# Patient Record
Sex: Male | Born: 1955 | Race: White | Hispanic: No | Marital: Single | Smoking: Never smoker
Health system: Southern US, Community
[De-identification: ages and names within clinical notes are randomized; demographics above are authoritative.]

## PROBLEM LIST (undated history)

## (undated) DIAGNOSIS — M2540 Effusion, unspecified joint: Secondary | ICD-10-CM

## (undated) DIAGNOSIS — R55 Syncope and collapse: Secondary | ICD-10-CM

## (undated) DIAGNOSIS — M2550 Pain in unspecified joint: Secondary | ICD-10-CM

## (undated) DIAGNOSIS — F988 Other specified behavioral and emotional disorders with onset usually occurring in childhood and adolescence: Secondary | ICD-10-CM

## (undated) DIAGNOSIS — S2249XA Multiple fractures of ribs, unspecified side, initial encounter for closed fracture: Secondary | ICD-10-CM

## (undated) HISTORY — PX: TONSILLECTOMY: SUR1361

---

## 2007-04-05 HISTORY — PX: APPENDECTOMY: SHX54

## 2007-04-23 ENCOUNTER — Inpatient Hospital Stay (HOSPITAL_COMMUNITY): Admission: EM | Admit: 2007-04-23 | Discharge: 2007-05-04 | Payer: Self-pay | Admitting: Emergency Medicine

## 2007-04-23 ENCOUNTER — Encounter (INDEPENDENT_AMBULATORY_CARE_PROVIDER_SITE_OTHER): Payer: Self-pay | Admitting: Surgery

## 2007-05-09 ENCOUNTER — Ambulatory Visit (HOSPITAL_COMMUNITY): Admission: RE | Admit: 2007-05-09 | Discharge: 2007-05-09 | Payer: Self-pay

## 2007-05-15 ENCOUNTER — Ambulatory Visit (HOSPITAL_COMMUNITY): Admission: RE | Admit: 2007-05-15 | Discharge: 2007-05-15 | Payer: Self-pay | Admitting: Diagnostic Radiology

## 2007-05-25 ENCOUNTER — Ambulatory Visit (HOSPITAL_COMMUNITY): Admission: RE | Admit: 2007-05-25 | Discharge: 2007-05-25 | Payer: Self-pay | Admitting: Surgery

## 2008-05-07 IMAGING — XA IR FISTULA/SINUS TRACT
1 series · 5 of 5 positions shown · non-contrast
Comparison: Study dated 05/04/07.

CLINICAL DATA: Right lower quadrant abscess.
EVALUATE ABSCESS DRAINAGE CATHETER AND COLONIC FISTULA:

[Series 1: run · 5 of 5 slices shown]
[im 1/5]
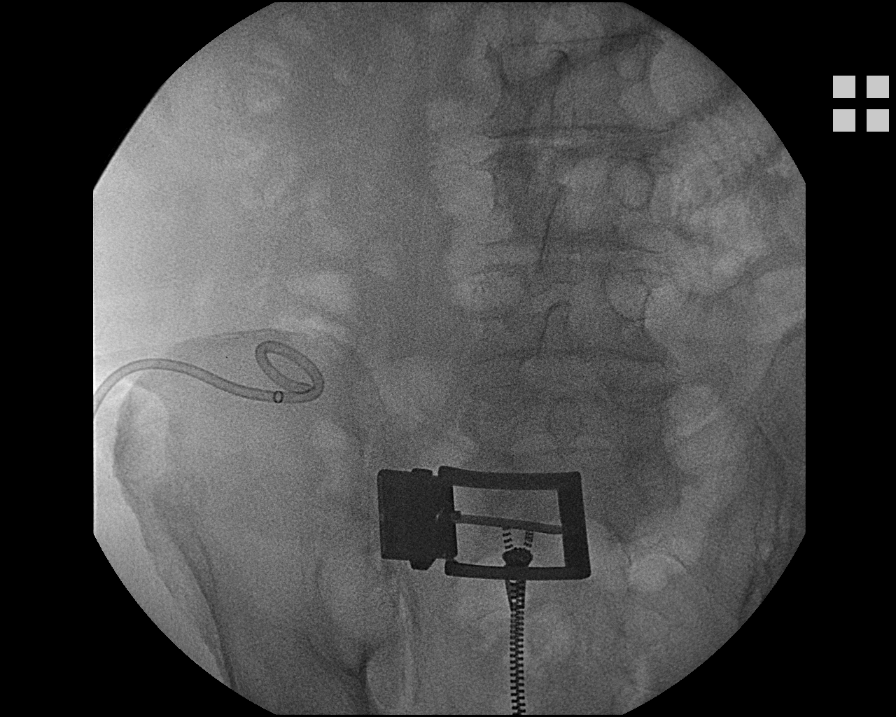
[im 2/5]
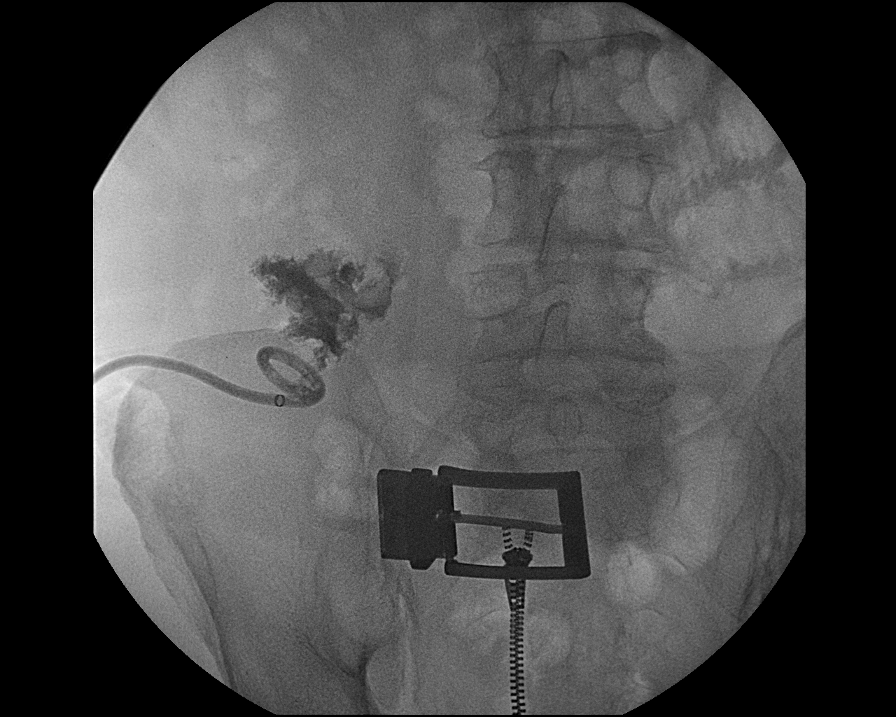
[im 3/5]
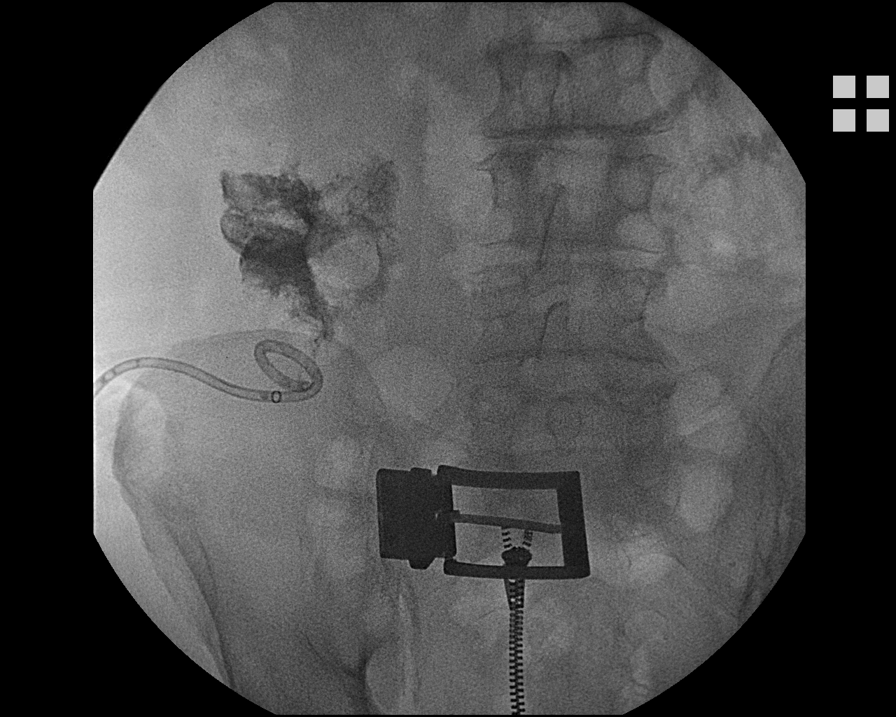
[im 4/5]
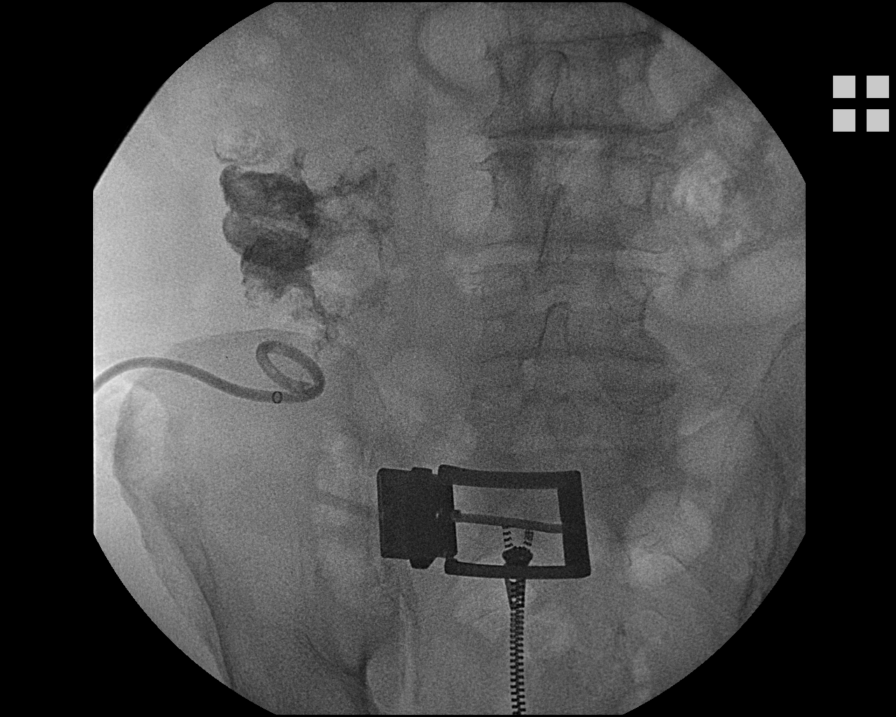
[im 5/5]
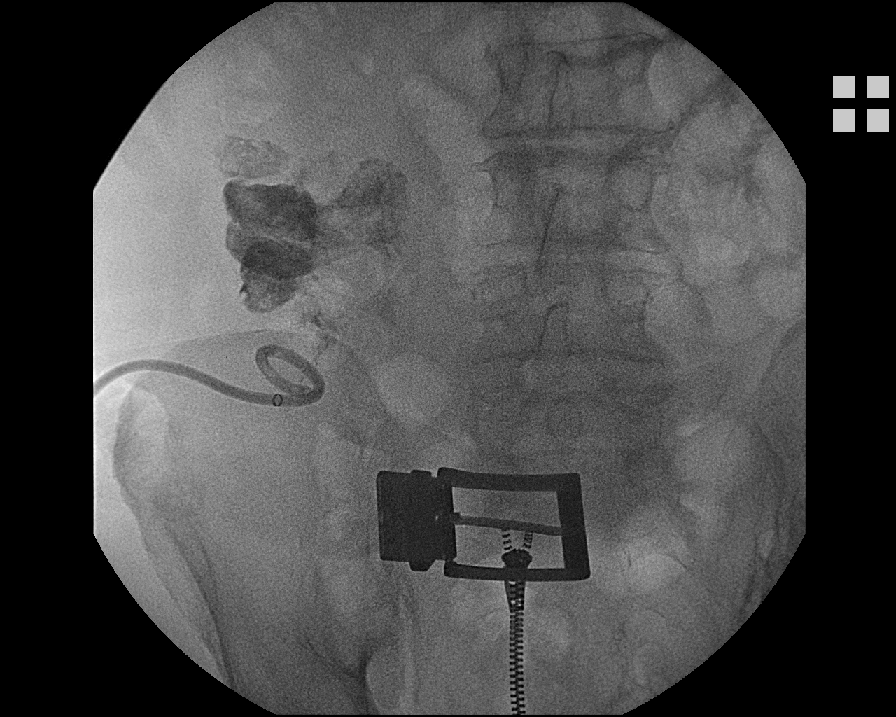

[5 of 5 positions shown; findings below may reference images not displayed]

Procedure:  Informed consent was obtained.  The patient was placed supine on the table.   A time out was performed.  The patient's catheter was found to be in stable position in the right lower quadrant of the abdomen.  Contrast injection demonstrated a small connection, which directly emptied into the cecum and right colon.  No significant residual abscess cavity.  In addition, the patient states that there has been minimal output.  
These findings were discussed with the patient.  I feel the patient would benefit from keeping the catheter in place to see if there is interval healing of this colonic fistula.
IMPRESSION: Persistent fistula connection to the right colon.  Plan is to keep the tube in place probably for one more week until the patient can have a follow-up fistulogram.

## 2010-08-17 NOTE — Op Note (Signed)
NAMEOSEI, ANGER NO.:  [id number]   MEDICAL RECORD NO.:  [id number]          PATIENT TYPE:  INP   LOCATION:  1829                         FACILITY:  [facility]   PHYSICIAN:  Sandria Bales. Ezzard Standing, M.D.  DATE OF BIRTH:  04/12/1955   DATE OF PROCEDURE:  04/23/2007  DATE OF DISCHARGE:                               OPERATIVE REPORT   Operative Note   Date of Surgery:  23 April 2007   PREOPERATIVE DIAGNOSIS:  Acute appendicitis.   POSTOPERATIVE DIAGNOSIS:  Ruptured appendicitis at base of appendix with  local abscess and appendolith.   PROCEDURE:  Laparoscopic appendectomy.   SURGEON:  Sandria Bales. Ezzard Standing, M.D.   FIRST ASSISTANT:  Launa Flight, PA student   ANESTHESIA:  General endotracheal.   ESTIMATED BLOOD LOSS:  Minimal.   HISTORY OF PRESENT ILLNESS:  This is a 55 year old white male who has no  identified primary medical doctor, developed acute abdominal pain on  Sunday, April 22, 2007, went to the Urgent Care on [facility] on  [employer] today, was sent to [facility] Radiology who did a  CT scan which suggested appendicitis.  He was sent to the [facility]  emergency room.  We have no records from either Urgent Care at [facility] or [facility]iology other than the DVD disk that had the  images on his CT scan.   I reviewed the CT scan on the disk with Dr. Abelino Derrick which appears  demonstrate an appendicitis with a fecolith.   I discussed with the patient proceeding with laparoscopic appendectomy.  I discussed the indications, potential complication, potential  complications include bleeding, infection which I think he already has,  open surgery, bowel injury.   OPERATIVE NOTE:  The patient placed in supine position, given a general  endotracheal anesthetic.  His was given a gram of cefoxitin at  initiation of procedure.  He had PAS stockings in place, Foley catheter  in place.  His abdomen was prepped with Betadine solution and  sterilely  draped.  A time out was taken to identify the patient and the procedure.   I used four trocars.  I made a 10-mm Hassan trocar at the infraumbilical  incision, secured with a 0-0 Vicryl suture, I placed a 5 mm trocar in  the right upper quadrant.  I placed a 5 mm trocar in the right lower  quadrant and a 10 mm trocar 11 mm trocar in the left lower quadrant.  Abdominal exploration revealed right left lobes of liver unremarkable.  Stomach unremarkable.  The patient had evidence of inflammatory changes  of small bowel with a walled off abscess in the right lower quadrant.  I  took down the omentum covering this abscess.  His appendix actually was  retrocecal and tracked up the lateral wall of his right colon.  At the  base of the appendix was approximately 1-cm hole with a stool  ball/appendolith which had ruptured and was in the middle of an abscess.   I irrigated out the abscess. I  removed the appendolith and tried to  make  sure I did not leave any material behind.  I then dissected off the  appendix from the lateral wall of the colon.  I got below the stump and  got a firing of a 45 Endo-GIA blue load across the base of the appendix  and placed the appendix in an EndoCatch bag and delivered it through the  umbilicus.  Because I was a little worried about how tenuous the staple  line was though it looked intact.  I oversewed the staple line with  three sutures of 2-0 Vicryl, sewn laparoscopically.  I then irrigated  his abdominal cavity with about 2 liters of saline.  There was no  residual purulence I could see.  There was no residual appendolith I  could see I then removed the trocars in turn.  There is no bleeding in  any trocar site.  The umbilical port was closed with 0-0 Vicryl suture.  The skin each port closed with 5-0 Vicryl suture, painted with tincture  of benzoin and steri-stripped.   The patient tolerated procedure well, was transferred to recovery room  good  condition.  Sponge and needle counts were correct the end of the  case.      Sandria Bales. Ezzard Standing, M.D.  Electronically Signed     DHN/MEDQ  D:  04/23/2007  T:  04/24/2007  Job:  [id number]

## 2010-08-17 NOTE — H&P (Signed)
NAMEAREG, BIALAS NO.:  [id number]   MEDICAL RECORD NO.:  [id number]          PATIENT TYPE:  INP   LOCATION:  1829                         FACILITY:  [facility]   PHYSICIAN:  Sandria Bales. Ezzard Standing, M.D.  DATE OF BIRTH:  06-18-1955   DATE OF ADMISSION:  04/23/2007  DATE OF DISCHARGE:                              HISTORY & PHYSICAL   HISTORY AND PHYSICAL:   DATE:  April 23, 2007   REFERRING PHYSICIAN:  Dr. Jake Church of [facility]   HISTORY OF ILLNESS:  This is a 55 year old white male who has no primary  medical physician and developed abdominal pain which he described as  knife-life in his right side on Sunday, the 18th of January 2009.  He  also was having multiple loose stools according to his mother, as many  as 8 or 10 a day.  He just kind of tolerated it Sunday but continued to  feel poorly on Monday, the 19th of January.   He went to [facility] and saw a physician whose name  he cannot remember.  That physician referred him to [facility]  Radiology.  We have no paperwork form [facility] of [facility] At  [facility] Radiology, he had a CT scan of his abdomen.  He was told he  had appendicitis and was given a disc but no paperwork and was told to  go to the [facility] Emergency Room.  Apparently, he sat in the waiting  area an hour plus or minus before being seen.   He denies a history of peptic ulcer disease, liver disease, pancreatic  disease or colon disease, and he has had no prior abdominal surgery.   PAST MEDICAL HISTORY:  He is allergic to penicillin.  He said it made  his legs turn orange, and this happened when he was a child.   CURRENT MEDICATIONS:  He is on no current medication.   REVIEW OF SYSTEMS:  NEUROLOGIC:  No history of seizure or loss of  consciousness.  PULMONARY:  Does not smoke cigarettes.  No pneumonia or tuberculosis.  CARDIAC:  No heart disease, chest pain, or hypertension.  GASTROINTESTINAL:  See history of present illness.  UROLOGIC:  No history of kidney stones or kidney infection.   In the emergency room he is accompanied by his mother and his pastor.   PAST SURGICAL HISTORY:  Prior operations include a tonsillectomy, and I  think he broke a bone in his right foot before.   SOCIAL HISTORY:  He is unemployed since June of 2008 when he worked in [employer].  He lives with his mother, who is at his bedside.   PHYSICAL EXAMINATION:  VITAL SIGNS:  Temperature 99.2, pulse 96, blood  pressure 137/86, respirations 22.  GENERAL:  He is a well-nourished, pleasant white male, alert and  cooperative on physical exam.  HEENT:  Unremarkable.  NECK:  Supple.  I feel no masses or thyromegaly.  LUNGS:  Clear to auscultation.  CARDIAC:  His heart has a regular rate and rhythm without murmur or  rub.  ABDOMEN:  He has positive bowel sounds, but he is tender with some  guarding in his right side in the right lower quadrant.  He is mildly  distended.  I feel no mass, no hernia.  EXTREMITIES:  He has good strength in the upper and lower extremities.  NEUROLOGIC:  Grossly intact.   LABORATORY DATA:  The labs I have show a white blood count of 19,100,  hemoglobin 17, hematocrit 50, sodium 136, potassium 4.0, chloride 100,  CO2 24, glucose 120.   DATA REVIEWED:  A CT scan, which again I had no typed report, was  reviewed with Abelino Derrick, and this was a disc that we looked at.  The  CT scan does show an apparent appendolith right at the base of the  appendix with some surrounding inflammation.  This would be consistent  with appendicitis.  There was no other obvious acute disease within his  abdomen.   IMPRESSION:  1. Appendicitis.  I discussed with the patient proceeding with an appendectomy.  I  discussed it with laparoscopy and open surgery and the risks including  bleeding, infection which I think he already has, and the possibility of  open surgery.  We  will go ahead tonight and proceed with his  appendectomy.      Sandria Bales. Ezzard Standing, M.D.  Electronically Signed     DHN/MEDQ  D:  04/23/2007  T:  04/24/2007  Job:  [id number]

## 2010-08-17 NOTE — Discharge Summary (Signed)
[name], [name] NO.:  [id number]   MEDICAL RECORD NO.:  [id number]          PATIENT TYPE:  INP   LOCATION:  5127                         FACILITY:  [facility]   PHYSICIAN:  [name] Lambert, M.D. DATE OF BIRTH:  1955-05-26   DATE OF ADMISSION:  04/23/2007  DATE OF DISCHARGE:  05/04/2007                               DISCHARGE SUMMARY   DISCHARGING PHYSICIAN:  [name] Lambert, M.D.   OPERATING SURGEON:  [name] Lambert, M.D.   REASON FOR ADMISSION:  This is a 55 year old white male who has no  primary medical physician and developed abdominal pain which he  describes as stabbing in his right side, on Sunday January 18.  He also  began having multiple loose stools, according to his mother.  He says  that they could be anywhere from 8-10 a day.  His pain began to worsen  late Sunday into early Monday, January 19.  He went to the Urgent Care  Clinic on [facility] and was seen by a physician there.  This  physician referred him to [facility].  Here he had a CT of  his abdomen, and was told that he had appendicitis, was given a disk but  no paperwork, and was told to come to [facility] Emergency  Room.   PHYSICAL EXAM:  The patient had a temperature of 99.2, and he had  positive bowel sounds; but he was tender with some guarding on the right  side in his right lower quadrant.  He was mildly distended, and no  masses or hernias were noted.  White blood cell count, at this time, was  19,100.  At this time, the patient was admitted to the hospital for an  appendectomy to be done by Dr. Ezzard Lambert.  Admitting diagnosis was acute  appendicitis.   HOSPITAL COURSE:  Once the patient was admitted he was taken to the OR  for a laparoscopic appendectomy.  Once in, the patient was found to have  a ruptured appendicitis with an abscess in the abdomen as well.  The  abscess was irrigated.  An appendicolith was found and was removed, as  well.  The  appendix was dissected off the lateral wall of the colon and  then removed.  The patient was then taken to his room and placed on IV  cefoxitin 1 gram.   The next day the patient was feeling well, and had tolerated clear  liquids for breakfast, and was wanting some real food.  At this time,  his pain was well controlled and was actually not taking anything for  pain medicine.  His abdomen, at this time, was somewhat soft, slightly  distended with some mild tenderness and positive bowel sounds.  His  incisions were covered with gauze and minimal dry drainage was noted.  At this time, he was continued on his IV antibiotics, and his diet was  advanced.   Later that night after dinner, the patient began to have some nausea and  had 2 emesis.  The next day he was back down to clear  liquids since he  had developed some nausea with a regular diet, and he was beginning to  form a postoperative ileus.  At this time his white blood cell count had  come down from 19,100 to 16,300.  His IV antibiotics were continued as  well.  An abdominal x-ray was also taken on postoperative day #2 which  showed a small-bowel obstruction; and, therefore, an NG-tube was placed  to decompress the stomach.  By postoperative day #3 there was no  improvement seen on his films that still showed large amounts of air  fluid levels, and his NG-tube was continued at this time.  He was having  green bilious drainage out of his tube.   At this time his white blood cell count had come down from 16,300 to  8700.  Later that night the patient states that his NG-tube was  accidentally pulled out.  The next morning on postoperative day #4 his  repeat abdominal x-rays showed no improvement.  However, the patient was  noted to have said that since his NG-tube, had come out he had no  emesis, and was feeling much better with the tube out of this nose.  However, when asked the patient did state that he did kind of full.  At this  point the patient refused to have the NG-tube replaced.  At this  time we were going to order a CT of the abdomen and pelvis to assess for  an abscess or something that might be causing the obstructive pattern on  the x-ray.   Later that day, I was called by the nurse to say that the patient had 4  emesis, and she had reinserted the NG-tube and had already gotten almost  a liter of output within an hour of replacing the NG-tube.  The CT scan  came back later that day and showed and 9 x 4 cm abscess in the right  lower quadrant that was adjacent to the cecum and abdominal wall.   At this time we asked interventional radiology to assess the patient for  a percutaneous drain to be placed in this abscess.  On postoperative day  #6 IR placed the percutaneous drain and removed 45 mL of cloudy red,  serous fluid.  At this time it was sent for culture which later came  back and showed some white blood cells, but no organisms were seen.   Over the next several days the patient continued with his percutaneous  drain as well as his NG-tube.  Several attempts were made to clamp the  patient's NG-tube, however, his residuals were greater than 200; and the  patient was put back up to suction.  On postoperative day #8, the  patient's white blood cell count began to rise to 14,100; and,  therefore, we are concerned that his abscess might not be healing or  that he might be forming another abscess.  However, we were also aware  that this might not be the  cause of his increase in white count, and a  urinalysis and culture and sensitivity, as well as blood cultures were  done which all came back negative.   At this time, the patient also was having beginning to have bowel sounds  as well as flatus and BM.  On postoperative day #9 the patient's NG-tube  was finally discontinued and the patient was allowed to have sips of  clear liquids.  The patient tolerated these clear liquids and by the  next day  was  tolerating a clear liquid tray for breakfast.  A repeat CT  of the abdomen was also performed which actually showed an improving  right lower quadrant abscess that measured 1 x 2 cm.  At this time the  patient's right lower quadrant drain was also beginning to have  decreased output, and the patient was taken down on the day of discharge  to have it injected to see if it was able to be removed at this time or  not.  I have instructed radiology that if the drain is not removed, to  please instruct the patient on discharge instructions for the drain, and  when he might need to return for further drain evaluation.   The patient's diet was advanced and on the date of discharge the patient  was tolerating a regular diet.  At this time the patient was still  having good bowel sounds, flatus, as well as bowel movement.   On postoperative day #1 the patient began to have elevations in his  blood pressure found to be 142/117 and was given a Catapres patch to  control his blood pressure.  This patch was given based on the fact that  the patient was n.p.o. with an NG-tube for several days.  The patient's  blood pressure began to stabilize and continued to be well managed in  the 130s/70s-80s.  The patient at time of discharge was sent home with a  prescription for a Catapres patch and told to follow up with a primary  care physician when he was able to find one.   On postoperative day #8 the patient was found to have a white count that  was beginning to elevate.  At this time, urinalysis and culture, as well  as, blood cultures and repeat diagnostics were done.  All of these came  back negative.  However, while the patient was in radiology having his  repeat CT done, one of the radiology techs noticed some redness on the  patient's arm where one of his IV sites had been discontinued several  days prior.  At this time the patient was found to have superficial  thrombophlebitis.  At this time the  patient was started on vancomycin  and his cefoxitin was discontinued.  Over the next several days the  patient's white count began to decrease and by day of discharge his  white count had normalized to 8100.  His redness, as well as induration,  continued to improve; and the patient was stable enough to be sent home  with a prescription of doxycycline for 7 days.   DISCHARGE DIAGNOSES:  1. Acute perforated appendicitis/status post laparoscopic      appendectomy.  2. Right lower quadrant abscess/status post percutaneous drain by      interventional radiology pending removal today.  3. Hypertension stable on Catapres patch.  4. Small-bowel obstruction resolved.  5. Leukocytosis which has resolved.  6. Superficial thrombophlebitis which is improving.   DISCHARGE MEDICATIONS:  The patient is sent home with prescriptions for:  1. Doxycycline 100 mg 1 tablet b.i.d. for 7 days.  2. Catapres patch 0.1 mg apply one patch every 7 days for      hypertension.  3. Prescription for ibuprofen 600 mg 1 tablet t.i.d. x1 week,  4. Darvocet N 100 one tablet q.6 h. as needed for pain.  5. He is recommended to take over-the-counter omeprazole as instructed      on the patch for 1 week while taking the  ibuprofen.  6. The patient is on no prior home medications.   DISCHARGE INSTRUCTIONS:  The patient is told that he is to increase  activity slowly; may shower, tomorrow, once he gets home (at this time  he is not to bathe for the next 2 weeks).  He is also informed that he  is not to lift anything greater than 15 pounds for the next 2 weeks.  He  has no restrictions, at this time, with his diet; and he is told that he  needs to return to see Dr. Ezzard Lambert in 1 week for followup of his  laparoscopic appendectomy, as well as for his superficial  thrombophlebitis.  He is also informed that if he begins to have a fever  greater than 101.5 or increase in abdominal pain, he needs to call our  office as well as  if he begins to have an increase in redness or pain in  his left upper extremity from his superficial thrombophlebitis, but he  was told to call our office as well.  He was also encouraged to find a  primary care physician and follow up with them as soon as possible for  management of his hypertension.  At this time I am not aware of whether  the patient will have his percutaneous drain removed, today, by  interventional radiology or not; however, I have written a not for  radiology to instruct the patient on further care for his drain or time  for further evaluation if the drain is not removed today.      [name] Cape, PA      [name] Lambert, M.D.  Electronically Signed    KEO/MEDQ  D:  05/04/2007  T:  05/04/2007  Job:  [id number]   cc:   [name] Lambert, M.D.

## 2010-12-24 LAB — CBC
HCT: 36 — ABNORMAL LOW
HCT: 38.9 — ABNORMAL LOW
HCT: 39.2
HCT: 39.8
HCT: 40.6
HCT: 42
HCT: 42.5
HCT: 50.1
Hemoglobin: 12.2 — ABNORMAL LOW
Hemoglobin: 13.5
Hemoglobin: 13.8
Hemoglobin: 14
Hemoglobin: 14.3
Hemoglobin: 17
MCHC: 33.8
MCHC: 33.9
MCHC: 33.9
MCHC: 34
MCHC: 34
MCHC: 34.8
MCV: 90.2
MCV: 90.3
MCV: 90.3
MCV: 90.8
MCV: 91.3
MCV: 92
Platelets: 282
Platelets: 331
Platelets: 337
Platelets: 407 — ABNORMAL HIGH
Platelets: 427 — ABNORMAL HIGH
Platelets: 433 — ABNORMAL HIGH
Platelets: 442 — ABNORMAL HIGH
Platelets: 457 — ABNORMAL HIGH
RBC: 3.94 — ABNORMAL LOW
RBC: 4.36
RBC: 4.64
RBC: 5.55
RDW: 12.9
RDW: 12.9
RDW: 13.2
RDW: 13.5
RDW: 13.7
WBC: 10.8 — ABNORMAL HIGH
WBC: 11.7 — ABNORMAL HIGH
WBC: 14.1 — ABNORMAL HIGH
WBC: 14.2 — ABNORMAL HIGH
WBC: 8.1
WBC: 8.7
WBC: 8.8

## 2010-12-24 LAB — BASIC METABOLIC PANEL
BUN: 10
BUN: 11
BUN: 11
BUN: 14
BUN: 14
BUN: 9
BUN: 9
CO2: 22
CO2: 24
CO2: 24
CO2: 26
Calcium: 8.2 — ABNORMAL LOW
Calcium: 8.4
Calcium: 8.7
Calcium: 8.9
Chloride: 100
Chloride: 108
Chloride: 97
Chloride: 97
Chloride: 97
Creatinine, Ser: 1.08
Creatinine, Ser: 1.18
Creatinine, Ser: 1.19
Creatinine, Ser: 1.35
Creatinine, Ser: 1.72 — ABNORMAL HIGH
GFR calc Af Amer: 51 — ABNORMAL LOW
GFR calc Af Amer: >60
GFR calc Af Amer: >60
GFR calc Af Amer: >60
GFR calc non Af Amer: 55 — ABNORMAL LOW
GFR calc non Af Amer: 59 — ABNORMAL LOW
GFR calc non Af Amer: >60
GFR calc non Af Amer: >60
GFR calc non Af Amer: >60
Glucose, Bld: 116 — ABNORMAL HIGH
Glucose, Bld: 136 — ABNORMAL HIGH
Potassium: 4.1
Potassium: 4.2
Potassium: 4.3
Potassium: 4.5
Potassium: 4.6
Sodium: 130 — ABNORMAL LOW
Sodium: 134 — ABNORMAL LOW
Sodium: 135
Sodium: 136
Sodium: 136

## 2010-12-24 LAB — BODY FLUID CULTURE: Culture: NO GROWTH

## 2010-12-24 LAB — CULTURE, BLOOD (ROUTINE X 2): Culture: NO GROWTH

## 2010-12-24 LAB — DIFFERENTIAL
Basophils Relative: 0
Eosinophils Absolute: 0
Eosinophils Relative: 0
Monocytes Absolute: 1
Monocytes Relative: 5

## 2010-12-24 LAB — URINE CULTURE
Colony Count: NO GROWTH
Culture: NO GROWTH

## 2011-02-28 ENCOUNTER — Encounter: Payer: Self-pay | Admitting: Family Medicine

## 2011-02-28 ENCOUNTER — Observation Stay (HOSPITAL_COMMUNITY)
Admission: EM | Admit: 2011-02-28 | Discharge: 2011-03-02 | Disposition: A | Discharge status: Home / Self Care | Payer: No Typology Code available for payment source | Source: Ambulatory Visit | Attending: General Surgery | Admitting: General Surgery

## 2011-02-28 ENCOUNTER — Emergency Department (HOSPITAL_COMMUNITY): Payer: No Typology Code available for payment source

## 2011-02-28 DIAGNOSIS — S2220XA Unspecified fracture of sternum, initial encounter for closed fracture: Principal | ICD-10-CM | POA: Insufficient documentation

## 2011-02-28 DIAGNOSIS — S2249XA Multiple fractures of ribs, unspecified side, initial encounter for closed fracture: Secondary | ICD-10-CM

## 2011-02-28 DIAGNOSIS — Y92410 Unspecified street and highway as the place of occurrence of the external cause: Secondary | ICD-10-CM | POA: Insufficient documentation

## 2011-02-28 DIAGNOSIS — IMO0002 Reserved for concepts with insufficient information to code with codable children: Secondary | ICD-10-CM | POA: Insufficient documentation

## 2011-02-28 DIAGNOSIS — R51 Headache: Secondary | ICD-10-CM | POA: Insufficient documentation

## 2011-02-28 LAB — POCT I-STAT, CHEM 8
BUN: 19 mg/dL (ref 6–23)
Calcium, Ion: 1.13 mmol/L (ref 1.12–1.32)
Creatinine, Ser: 1.4 mg/dL — ABNORMAL HIGH (ref 0.50–1.35)
TCO2: 27 mmol/L (ref 0–100)

## 2011-02-28 MED ORDER — PANTOPRAZOLE SODIUM 40 MG PO TBEC
40.0000 mg | DELAYED_RELEASE_TABLET | Freq: Every day | ORAL | Status: DC
  Administered 2011-03-01: 40 mg via ORAL
  Filled 2011-02-28 (×2): qty 1

## 2011-02-28 MED ORDER — SODIUM CHLORIDE 0.9 % IV SOLN: 250.0000 mL | INTRAVENOUS | Status: DC | PRN

## 2011-02-28 MED ORDER — ONDANSETRON HCL 4 MG PO TABS: 4.0000 mg | ORAL_TABLET | Freq: Four times a day (QID) | ORAL | Status: DC | PRN

## 2011-02-28 MED ORDER — SODIUM CHLORIDE 0.9 % IV BOLUS (SEPSIS)
1000.0000 mL | Freq: Once | INTRAVENOUS | Status: AC
  Administered 2011-02-28: 1000 mL via INTRAVENOUS

## 2011-02-28 MED ORDER — OXYCODONE HCL 5 MG PO TABS
5.0000 mg | ORAL_TABLET | ORAL | Status: DC | PRN
  Administered 2011-03-01 (×2): 5 mg via ORAL
  Filled 2011-02-28 (×2): qty 1

## 2011-02-28 MED ORDER — HYDROMORPHONE HCL PF 1 MG/ML IJ SOLN
INTRAMUSCULAR | Status: AC
  Administered 2011-02-28: 1 mg via INTRAVENOUS
  Filled 2011-02-28: qty 1

## 2011-02-28 MED ORDER — MORPHINE SULFATE 4 MG/ML IJ SOLN
4.0000 mg | Freq: Once | INTRAMUSCULAR | Status: AC
  Administered 2011-02-28: 4 mg via INTRAVENOUS
  Filled 2011-02-28: qty 1

## 2011-02-28 MED ORDER — ONDANSETRON HCL 4 MG/2ML IJ SOLN
4.0000 mg | Freq: Once | INTRAMUSCULAR | Status: AC
Start: 2011-02-28 — End: 2011-02-28
  Administered 2011-02-28: 4 mg via INTRAVENOUS
  Filled 2011-02-28: qty 2

## 2011-02-28 MED ORDER — SODIUM CHLORIDE 0.9 % IJ SOLN
3.0000 mL | Freq: Two times a day (BID) | INTRAMUSCULAR | Status: DC
  Administered 2011-02-28 – 2011-03-01 (×3): 3 mL via INTRAVENOUS

## 2011-02-28 MED ORDER — IOHEXOL 300 MG/ML  SOLN
80.0000 mL | Freq: Once | INTRAMUSCULAR | Status: AC | PRN
  Administered 2011-02-28: 80 mL via INTRAVENOUS

## 2011-02-28 MED ORDER — SODIUM CHLORIDE 0.9 % IJ SOLN: 3.0000 mL | INTRAMUSCULAR | Status: DC | PRN

## 2011-02-28 MED ORDER — ONDANSETRON HCL 4 MG/2ML IJ SOLN
4.0000 mg | Freq: Four times a day (QID) | INTRAMUSCULAR | Status: DC | PRN
  Administered 2011-02-28 – 2011-03-01 (×2): 4 mg via INTRAVENOUS
  Filled 2011-02-28 (×2): qty 2

## 2011-02-28 MED ORDER — DOCUSATE SODIUM 100 MG PO CAPS
100.0000 mg | ORAL_CAPSULE | Freq: Two times a day (BID) | ORAL | Status: DC
  Administered 2011-02-28 – 2011-03-01 (×3): 100 mg via ORAL
  Filled 2011-02-28 (×7): qty 1

## 2011-02-28 MED ORDER — PANTOPRAZOLE SODIUM 40 MG IV SOLR
40.0000 mg | Freq: Every day | INTRAVENOUS | Status: DC
  Administered 2011-02-28: 40 mg via INTRAVENOUS
  Filled 2011-02-28 (×3): qty 40

## 2011-02-28 MED ORDER — BISACODYL 10 MG RE SUPP: 10.0000 mg | Freq: Every day | RECTAL | Status: DC | PRN

## 2011-02-28 MED ORDER — HYDROMORPHONE HCL PF 1 MG/ML IJ SOLN
0.5000 mg | INTRAMUSCULAR | Status: DC | PRN
  Administered 2011-02-28 – 2011-03-02 (×6): 1 mg via INTRAVENOUS
  Filled 2011-02-28 (×5): qty 1

## 2011-02-28 MED ORDER — MORPHINE SULFATE 4 MG/ML IJ SOLN
6.0000 mg | Freq: Once | INTRAMUSCULAR | Status: AC
  Administered 2011-02-28: 4 mg via INTRAVENOUS
  Filled 2011-02-28: qty 1
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
## 2011-03-01 ENCOUNTER — Observation Stay (HOSPITAL_COMMUNITY): Payer: No Typology Code available for payment source

## 2011-03-01 LAB — BASIC METABOLIC PANEL
CO2: 28 mEq/L (ref 19–32)
Chloride: 104 mEq/L (ref 96–112)
Sodium: 139 mEq/L (ref 135–145)

## 2011-03-01 LAB — CBC
MCV: 90.5 fL (ref 78.0–100.0)
Platelets: 171 10*3/uL (ref 150–400)
RBC: 4.84 MIL/uL (ref 4.22–5.81)
WBC: 8.8 10*3/uL (ref 4.0–10.5)

## 2011-03-01 MED ORDER — OXYCODONE HCL 5 MG PO TABS
5.0000 mg | ORAL_TABLET | ORAL | Status: DC | PRN
  Administered 2011-03-01 – 2011-03-02 (×3): 10 mg via ORAL
  Filled 2011-03-01 (×4): qty 2

## 2011-03-01 MED ORDER — ENOXAPARIN SODIUM 30 MG/0.3ML ~~LOC~~ SOLN
30.0000 mg | Freq: Two times a day (BID) | SUBCUTANEOUS | Status: DC
  Administered 2011-03-01 (×2): 30 mg via SUBCUTANEOUS
  Filled 2011-03-01 (×4): qty 0.3

## 2011-03-01 NOTE — Progress Notes (Signed)
Clinical Social Work-Please see shadow chart for full assessment; pt declines any substance use and declines any CSW support services. SBIRT completed. No further needs at this time. Jodean Lima, [phone]
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
## 2011-03-02 ENCOUNTER — Observation Stay (HOSPITAL_COMMUNITY): Payer: No Typology Code available for payment source

## 2011-03-02 DIAGNOSIS — S2249XA Multiple fractures of ribs, unspecified side, initial encounter for closed fracture: Secondary | ICD-10-CM

## 2011-03-02 DIAGNOSIS — S2220XA Unspecified fracture of sternum, initial encounter for closed fracture: Secondary | ICD-10-CM

## 2011-03-02 MED ORDER — OXYCODONE-ACETAMINOPHEN 7.5-325 MG PO TABS: 1.0000 | ORAL_TABLET | ORAL | Status: AC | PRN
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
## 2011-03-02 NOTE — Progress Notes (Signed)
This patient has been seen and I agree with the findings and treatment plan. Okay to go home.   Marta Lamas. Gae Bon, MD, FACS [phone] (pager) [phone] (direct pager) Trauma Surgeon

## 2011-03-02 NOTE — Discharge Summary (Signed)
This patient has been seen and I agree with the findings and treatment plan.  Alaylah Heatherington O. Brindle Leyba, III, MD, FACS [phone] (pager) [phone] (direct pager) Trauma Surgeon  
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
## 2012-12-31 ENCOUNTER — Emergency Department (HOSPITAL_COMMUNITY): Payer: BC Managed Care – PPO

## 2012-12-31 ENCOUNTER — Observation Stay (HOSPITAL_COMMUNITY)
Admission: EM | Admit: 2012-12-31 | Discharge: 2013-01-01 | Disposition: A | Discharge status: Home / Self Care | Payer: BC Managed Care – PPO | Attending: Internal Medicine | Admitting: Internal Medicine

## 2012-12-31 ENCOUNTER — Encounter (HOSPITAL_COMMUNITY): Payer: Self-pay | Admitting: Nurse Practitioner

## 2012-12-31 DIAGNOSIS — W19XXXA Unspecified fall, initial encounter: Secondary | ICD-10-CM | POA: Insufficient documentation

## 2012-12-31 DIAGNOSIS — N179 Acute kidney failure, unspecified: Secondary | ICD-10-CM

## 2012-12-31 DIAGNOSIS — S42009A Fracture of unspecified part of unspecified clavicle, initial encounter for closed fracture: Secondary | ICD-10-CM | POA: Insufficient documentation

## 2012-12-31 DIAGNOSIS — E860 Dehydration: Secondary | ICD-10-CM

## 2012-12-31 DIAGNOSIS — S2231XA Fracture of one rib, right side, initial encounter for closed fracture: Secondary | ICD-10-CM

## 2012-12-31 DIAGNOSIS — R55 Syncope and collapse: Principal | ICD-10-CM

## 2012-12-31 DIAGNOSIS — S42001A Fracture of unspecified part of right clavicle, initial encounter for closed fracture: Secondary | ICD-10-CM

## 2012-12-31 DIAGNOSIS — S2239XA Fracture of one rib, unspecified side, initial encounter for closed fracture: Secondary | ICD-10-CM | POA: Insufficient documentation

## 2012-12-31 DIAGNOSIS — M25519 Pain in unspecified shoulder: Secondary | ICD-10-CM | POA: Insufficient documentation

## 2012-12-31 DIAGNOSIS — S0100XA Unspecified open wound of scalp, initial encounter: Secondary | ICD-10-CM | POA: Insufficient documentation

## 2012-12-31 DIAGNOSIS — I10 Essential (primary) hypertension: Secondary | ICD-10-CM

## 2012-12-31 DIAGNOSIS — IMO0002 Reserved for concepts with insufficient information to code with codable children: Secondary | ICD-10-CM | POA: Insufficient documentation

## 2012-12-31 HISTORY — DX: Syncope and collapse: R55

## 2012-12-31 HISTORY — DX: Multiple fractures of ribs, unspecified side, initial encounter for closed fracture: S22.49XA

## 2012-12-31 LAB — POCT I-STAT, CHEM 8
Chloride: 103 mEq/L (ref 96–112)
Creatinine, Ser: 1.3 mg/dL (ref 0.50–1.35)
Glucose, Bld: 92 mg/dL (ref 70–99)
HCT: 50 % (ref 39.0–52.0)
Potassium: 4.3 mEq/L (ref 3.5–5.1)
Sodium: 139 mEq/L (ref 135–145)
TCO2: 28 mmol/L (ref 0–100)

## 2012-12-31 LAB — GLUCOSE, CAPILLARY: Glucose-Capillary: 102 mg/dL — ABNORMAL HIGH (ref 70–99)

## 2012-12-31 LAB — CBC
HCT: 47.9 % (ref 39.0–52.0)
Hemoglobin: 16.8 g/dL (ref 13.0–17.0)
MCH: 31.2 pg (ref 26.0–34.0)
Platelets: 176 10*3/uL (ref 150–400)
RDW: 12.9 % (ref 11.5–15.5)
WBC: 13.4 10*3/uL — ABNORMAL HIGH (ref 4.0–10.5)

## 2012-12-31 LAB — POCT I-STAT TROPONIN I: Troponin i, poc: 0 ng/mL (ref 0.00–0.08)

## 2012-12-31 MED ORDER — ONDANSETRON HCL 4 MG/2ML IJ SOLN: 4.0000 mg | Freq: Three times a day (TID) | INTRAMUSCULAR | Status: AC | PRN

## 2012-12-31 MED ORDER — SODIUM CHLORIDE 0.9 % IV SOLN: 250.0000 mL | INTRAVENOUS | Status: DC | PRN

## 2012-12-31 MED ORDER — ALUM & MAG HYDROXIDE-SIMETH 200-200-20 MG/5ML PO SUSP: 30.0000 mL | Freq: Four times a day (QID) | ORAL | Status: DC | PRN

## 2012-12-31 MED ORDER — SODIUM CHLORIDE 0.9 % IJ SOLN: 3.0000 mL | Freq: Two times a day (BID) | INTRAMUSCULAR | Status: DC

## 2012-12-31 MED ORDER — HYDROCODONE-ACETAMINOPHEN 5-325 MG PO TABS
1.0000 | ORAL_TABLET | ORAL | Status: DC | PRN
  Administered 2012-12-31 – 2013-01-01 (×4): 2 via ORAL
  Filled 2012-12-31 (×4): qty 2

## 2012-12-31 MED ORDER — HYDROCODONE-ACETAMINOPHEN 5-325 MG PO TABS
1.0000 | ORAL_TABLET | Freq: Once | ORAL | Status: AC
  Administered 2012-12-31: 1 via ORAL
  Filled 2012-12-31: qty 1

## 2012-12-31 MED ORDER — SODIUM CHLORIDE 0.9 % IJ SOLN: 3.0000 mL | INTRAMUSCULAR | Status: DC | PRN

## 2012-12-31 MED ORDER — ZOLPIDEM TARTRATE 5 MG PO TABS: 5.0000 mg | ORAL_TABLET | Freq: Every evening | ORAL | Status: DC | PRN

## 2012-12-31 MED ORDER — SODIUM CHLORIDE 0.9 % IV SOLN
INTRAVENOUS | Status: AC
  Administered 2012-12-31 – 2013-01-01 (×2): via INTRAVENOUS

## 2012-12-31 MED ORDER — SODIUM CHLORIDE 0.9 % IJ SOLN
3.0000 mL | Freq: Two times a day (BID) | INTRAMUSCULAR | Status: DC
  Administered 2013-01-01: 3 mL via INTRAVENOUS

## 2012-12-31 NOTE — Progress Notes (Signed)
12/31/12 Nursing note  Called ED for Report on patient. Obtained report at this time. Esai Stecklein, Randall An RN

## 2012-12-31 NOTE — ED Provider Notes (Signed)
Medical screening examination/treatment/procedure(s) were conducted as a shared visit with non-physician practitioner(s) and myself.  I personally evaluated the patient during the encounter  Suspect syncope with concussion, patient has amnesia so cannot recall if had prodrome or not, anticipate observation.  Hurman Horn, MD 01/02/13 [phone]
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
## 2012-12-31 NOTE — ED Notes (Signed)
MD at bedside. 

## 2012-12-31 NOTE — H&P (Signed)
[facility] History and Physical  [name] Lambert:[id number] DOB: [birth year] DOA: 12/31/2012  Referring physician: Dr. Fonnie Jarvis PCP: No primary provider on file.  Specialists: None  Chief Complaint: syncope  HPI: [name] Lambert is a 57 y.o. male with PMH of ADD on Adderal daily who presents with a sycopal episode this afternoon.  He has been in his usual state of health until the episode.  He reports that he had gotten up from sitting and walked at least 40 feet when he fell to the ground.  He had no prodrome of vision change, headache, weakness or diaphoresis.  He does not remember the event.  There was a friend with him who did not notice any seizure like activity or post event confusion, facial droop or weakness.  After falling he was able to call his mother and was clear on the phone.  He does not remember making the call or being transported to the ED.  The stretch of time he does not remember is around 10 minutes after the event.  He currently has a scalp laceration, right clavicle fracture and right rib fracture.  Review of Systems: The patient denies anorexia, fever, weight loss,, vision loss, decreased hearing, hoarseness, chest pain, syncope, dyspnea on exertion, peripheral edema, balance deficits, hemoptysis, abdominal pain, melena, hematochezia, severe indigestion/heartburn, hematuria, incontinence, genital sores, muscle weakness, suspicious skin lesions, transient blindness, difficulty walking, depression, unusual weight change, abnormal bleeding, enlarged lymph nodes.   History reviewed. No pertinent past medical history. Past Surgical History  Procedure Laterality Date  . Appendectomy     Social History:  reports that he has never smoked. He has never used smokeless tobacco. He reports that he does not drink alcohol or use illicit drugs.  Patient performs all of his own ADL's.  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  Reports  that he is very active, does a lot of walking and lifting.  Allergies  Allergen Reactions  . Penicillins Rash    History reviewed.  NO family history of seizures, strokes, grand father with heart disease, has one sister who is healthy.  Prior to Admission medications   Medication Sig Start Date End Date Taking? Authorizing Provider  Amphetamine-Dextroamphetamine (ADDERALL PO) Take by mouth.   Yes Historical Provider, MD   Physical Exam: Filed Vitals:   12/31/12 1730  BP: 134/86  Pulse: 98  Temp:   Resp: 28     General:  No distress  Eyes: PERRLA, EOMI, conjunctiva clear  ENT: MMM, no LAD, neck supple  Cardiovascular: RRR no MRG  Respiratory: CTAB with good air movement  Abdomen: BS normal, good air movement  Skin: right scalp laceration, right wrist abrasion, abrasion over the right shoulder  Musculoskeletal: right arm in sling painful to move, tender over the right ribs, no bruising over the ribs, no deformity  Psychiatric: calm appropriate  Neurologic: alert, oriented, conversant, does have more movement in the right side of his mouth than the left, but he reports this has been true since childhood, his mother confirms, on testing CV II-XII are intact, strength is 5/5 throughout, sensation intact and symetrical  Labs on Admission:  Basic Metabolic Panel:  Recent Labs Lab 12/31/12 1623  NA 139  K 4.3  CL 103  GLUCOSE 92  BUN 20  CREATININE 1.30   Liver Function Tests: No results found for this basename: AST, ALT, ALKPHOS, BILITOT, PROT, ALBUMIN,  in the last 168 hours No  results found for this basename: LIPASE, AMYLASE,  in the last 168 hours No results found for this basename: AMMONIA,  in the last 168 hours CBC:  Recent Labs Lab 12/31/12 1623 12/31/12 1656  WBC  --  13.4*  HGB 17.0 16.8  HCT 50.0 47.9  MCV  --  89.0  PLT  --  176   Cardiac Enzymes: No results found for this basename: CKTOTAL, CKMB, CKMBINDEX, TROPONINI,  in the last 168  hours  BNP (last 3 results) No results found for this basename: PROBNP,  in the last 8760 hours CBG:  Recent Labs Lab 12/31/12 1333  GLUCAP 102*    Radiological Exams on Admission: Dg Chest 2 View  12/31/2012   CLINICAL DATA:  Fall.  Right lateral chest pain.  EXAM: CHEST  2 VIEW  COMPARISON:  Multiple priors, most recent 02/22/2011  FINDINGS: The heart size and mediastinal contours are within normal limits. No focal consolidation or edema. Left apical pleural thickening is unchanged. No pneumothorax or pleural effusion. Displaced right anterior lateral 4th rib fracture is noted. Displaced comminuted fracture of the lateral aspect of the clavicle is noted.  IMPRESSION: 1. Right clavicular fracture. 2. Right 4th rib fracture. No pneumothorax.   Electronically Signed   By: Jerene Dilling M.D.   On: 12/31/2012 15:06   Dg Shoulder Right  12/31/2012   *RADIOLOGY REPORT*  Clinical Data: The patient fell today with right lateral chest pain  RIGHT SHOULDER - 2+ VIEW  Comparison:  None  Findings: There is a fracture through the lateral tip of the right clavicle, with the lateral fracture fragment displaced 14 mm inferolaterally.  The fracture is mildly comminuted.  There is mild degenerative change at the glenohumeral level with no evidence of humerus fracture.  IMPRESSION: Clavicle fracture.   Original Report Authenticated By: Esperanza Heir, M.D.   Ct Head Wo Contrast - If Head Trauma Is Known/suspected And/or Pt Is Taking Anticoagulant  12/31/2012   CLINICAL DATA:  Fall. Syncopal episode. The patient is amnestic to the event.  EXAM: CT HEAD WITHOUT CONTRAST  CT CERVICAL SPINE WITHOUT CONTRAST  TECHNIQUE: Multidetector CT imaging of the head and cervical spine was performed following the standard protocol without intravenous contrast. Multiplanar CT image reconstructions of the cervical spine were also generated.  COMPARISON:  CT head and cervical 02/28/2011.  FINDINGS: CT HEAD FINDINGS  No acute  cortical infarct, hemorrhage, or mass lesion is present. The ventricles are of normal size. No significant extra-axial fluid collection is present.  And the paranasal sinuses and mastoid air cells are clear. The osseous skull is intact.  Soft tissue swelling is evident in the high right frontal scalp. The punctate hyperdense focus likely represents some element of debris.  CT CERVICAL SPINE FINDINGS  The cervical spine is imaged from the skull base through T3-4. Degenerative grade 1 anterolisthesis is again noted at C2-3 with left greater than right facet hypertrophy. Chronic loss of disc height is evident at C3-4, and C6-7. Multilevel uncovertebral spurring is present. Mild osseous foraminal narrowing is present at multiple levels, greatest on the left at C5-6.  The lung apices are clear. The soft tissues of the neck are unremarkable.  IMPRESSION: CT HEAD IMPRESSION  1. Normal CT appearance of the brain. 2. Soft tissue swelling of the right frontal scalp with probable radiopaque debris along the skin surface.  CT CERVICAL SPINE IMPRESSION  1. Similar appearance of moderate spondylosis throughout cervical spine. 2. No acute fracture or traumatic subluxation.  Electronically Signed   By: Gennette Pac   On: 12/31/2012 14:52   Ct Cervical Spine Wo Contrast  12/31/2012   CLINICAL DATA:  Fall. Syncopal episode. The patient is amnestic to the event.  EXAM: CT HEAD WITHOUT CONTRAST  CT CERVICAL SPINE WITHOUT CONTRAST  TECHNIQUE: Multidetector CT imaging of the head and cervical spine was performed following the standard protocol without intravenous contrast. Multiplanar CT image reconstructions of the cervical spine were also generated.  COMPARISON:  CT head and cervical 02/28/2011.  FINDINGS: CT HEAD FINDINGS  No acute cortical infarct, hemorrhage, or mass lesion is present. The ventricles are of normal size. No significant extra-axial fluid collection is present.  And the paranasal sinuses and mastoid air cells are  clear. The osseous skull is intact.  Soft tissue swelling is evident in the high right frontal scalp. The punctate hyperdense focus likely represents some element of debris.  CT CERVICAL SPINE FINDINGS  The cervical spine is imaged from the skull base through T3-4. Degenerative grade 1 anterolisthesis is again noted at C2-3 with left greater than right facet hypertrophy. Chronic loss of disc height is evident at C3-4, and C6-7. Multilevel uncovertebral spurring is present. Mild osseous foraminal narrowing is present at multiple levels, greatest on the left at C5-6.  The lung apices are clear. The soft tissues of the neck are unremarkable.  IMPRESSION: CT HEAD IMPRESSION  1. Normal CT appearance of the brain. 2. Soft tissue swelling of the right frontal scalp with probable radiopaque debris along the skin surface.  CT CERVICAL SPINE IMPRESSION  1. Similar appearance of moderate spondylosis throughout cervical spine. 2. No acute fracture or traumatic subluxation.   Electronically Signed   By: Gennette Pac   On: 12/31/2012 14:52    EKG: Independently reviewed. NSR, no acute changes  Assessment/Plan 1) syncopal event: history, exam and EKG not revealing.  Orthostatics negative. No prodrome or recent chest pain/change in exercise tolerance/palpatations. Will admit to tele to monitor for abnormal rhythm. Will check ECHO to rule out structural/valvular cause.  Labs are not suggestive- electrolytes are normal. Check a TSH.  WBCs are slightly elevated, recheck in am. 2) hypertension: Monitor, this may be due to recent fall and stress. 3) clavicle and rib fracture: pain management, splint.   Code Status:  FULL Family Communication:  Spoke with patient and his mother at the bedside Disposition Plan: admit to OBS  Time spent: 60 minutes  [facility] Pager [phone]   If 7PM-7AM, please contact night-coverage [email or web address] Password [email or web address] 12/31/2012, 5:49 PM

## 2013-01-01 ENCOUNTER — Encounter (HOSPITAL_COMMUNITY): Payer: Self-pay | Admitting: General Practice

## 2013-01-01 DIAGNOSIS — N179 Acute kidney failure, unspecified: Secondary | ICD-10-CM

## 2013-01-01 DIAGNOSIS — E860 Dehydration: Secondary | ICD-10-CM

## 2013-01-01 DIAGNOSIS — I517 Cardiomegaly: Secondary | ICD-10-CM

## 2013-01-01 LAB — CBC
MCH: 32.2 pg (ref 26.0–34.0)
Platelets: 166 10*3/uL (ref 150–400)
RDW: 13.2 % (ref 11.5–15.5)
WBC: 8.1 10*3/uL (ref 4.0–10.5)

## 2013-01-01 LAB — BASIC METABOLIC PANEL
Calcium: 8.9 mg/dL (ref 8.4–10.5)
Chloride: 102 mEq/L (ref 96–112)
Creatinine, Ser: 1.19 mg/dL (ref 0.50–1.35)
GFR calc Af Amer: 76 mL/min — ABNORMAL LOW (ref >90)
GFR calc non Af Amer: 65 mL/min — ABNORMAL LOW (ref >90)
Potassium: 4 mEq/L (ref 3.5–5.1)
Sodium: 137 mEq/L (ref 135–145)

## 2013-01-01 LAB — TSH: TSH: 1.344 u[IU]/mL (ref 0.350–4.500)

## 2013-01-01 MED ORDER — HYDROCODONE-ACETAMINOPHEN 5-325 MG PO TABS: 1.0000 | ORAL_TABLET | ORAL | Status: DC | PRN

## 2013-01-01 NOTE — Progress Notes (Signed)
01/01/13 Nursing note.  Patient given AVS, discharge instructions and paper prescription. Will discharge home with family as ordered. Ryder Chesmore, Randall An RN

## 2013-01-01 NOTE — ED Provider Notes (Signed)
Medical screening examination/treatment/procedure(s) were performed by non-physician practitioner and as supervising physician I was immediately available for consultation/collaboration.   Shanna Cisco, MD 01/01/13 [phone]

## 2013-01-01 NOTE — Progress Notes (Signed)
IV Dc/ catheter intact
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
## 2013-01-01 NOTE — Progress Notes (Signed)
  Echocardiogram 2D Echocardiogram has been performed.  [name] Lambert 01/01/2013, 2:58 PM

## 2013-01-01 NOTE — Progress Notes (Signed)
Patient orders for bedrest, however patient states his bowel need  To move, persistent about going in the bathroom.   One person assist to the bathroom, gait steady, no complaints of pain.  Did not have BM  Only passing flatus.   Assisted back to bed .                                              Governor Specking, RN

## 2013-01-01 NOTE — Progress Notes (Signed)
While consulting with RN, learned that yesterday was patient's birthday and patient was bummed to be in the hospital. Chaplain initiated visit with patient, who said he was leaving church and had a birthday party planned when he passed out. The next thing he remembers is waking in the ED. Patient is [employer] and has [employer], manufacturing systems and congregation. Eager to go home, mood is high. Chaplain wished patient a "happy birthday," and offered compassionate presence.  Maurene Capes, Chaplain

## 2013-01-01 NOTE — Discharge Summary (Signed)
Physician Discharge Summary  [name] Lambert ZOX:[id number] DOB: 04-Jul-1955 DOA: 12/31/2012  PCP: No primary provider on file.  Admit date: 12/31/2012 Discharge date: 01/01/2013  Time spent: 30 minutes  Recommendations for Outpatient Follow-up:  Patient with clavicular fracture, will need follow-up with orthopedic surgery.  Discharge Diagnoses:  Active Problems:   Syncope and collapse   Fracture of clavicle, right, closed   Right rib fracture   Acute renal failure   Dehydration   Discharge Condition: Stable/improved  Diet recommendation: Heart healthy diet  Filed Weights   12/31/12 1919  Weight: 88.1 kg (194 lb 3.6 oz)    History of present illness:  [name] Lambert is a 57 y.o. male with PMH of ADD on Adderal daily who presents with a sycopal episode this afternoon. He has been in his usual state of health until the episode. He reports that he had gotten up from sitting and walked at least 40 feet when he fell to the ground. He had no prodrome of vision change, headache, weakness or diaphoresis. He does not remember the event. There was a friend with him who did not notice any seizure like activity or post event confusion, facial droop or weakness. After falling he was able to call his mother and was clear on the phone. He does not remember making the call or being transported to the ED. The stretch of time he does not remember is around 10 minutes after the event. He currently has a scalp laceration, right clavicle fracture and right rib fracture.   Hospital Course:  Patient is a pleasant 57 year old gentleman who was admitted to the medicine service after having a syncopal episode. Patient had reported being in his usual state health, and walked 40 feet, been having a syncopal event. He denied chest pain shortness of breath palpitations or seizure activity prior to this event. In emergent apartment  EKG and troponin were within normal limits. He was placed in 24 hour observation with  continuous cardiac monitoring. Imaging studies revealing right clavicular fracture, mildly comminuted, displaced 14 mm inferior laterally. Dr. Rennis Chris orthopedic surgery was consulted. Patient was not interested in undergoing surgical intervention at this time. He expresses desire for conservative management and follow-up at Dr. Dub Mikes office in 3-4 weeks for repeat imaging. He had a transthoracic echocardiogram which showed an ejection fraction of 55-60% without wall motion abnormalities. Lab work may have suggested acute renal failure in setting of dehydration, as his creatinine improved from 1.3 on presentation to 1.19, BUN down from 20 to 17, after receiving IV fluids. It's possible that syncopal event may have resulted from dehydration or perhaps may have been vasovagal in origin. Patient discharged in stable condition on 01/01/2013.   Procedures:  Transthoracic echocardiogram performed on 01/01/2013 show an ejection fraction of 55-60% without wall motion abnormalities. Grade 1 diastolic dysfunction  Consultations: Orthopedic surgery: Dr. Rennis Chris  Discharge Exam: Filed Vitals:   01/01/13 1510  BP: 142/80  Pulse:   Temp:   Resp:     General: Patient is in no acute distress, awake alert oriented Cardiovascular: regular rate and rhythm normal S1-S2 no murmurs rubs or gallops Respiratory: lungs clear to auscultation bilaterally no wheezing rhonchi or rales Abdomen: soft nontender nondistended positive bowel sounds Extremities: no edema, right upper extremity in a sling. Bruising noted over his right shoulder  Discharge Instructions  Discharge Orders   Future Orders Complete By Expires   Call MD for:  difficulty breathing, headache or visual disturbances  As directed  Call MD for:  persistant nausea and vomiting  As directed    Call MD for:  severe uncontrolled pain  As directed    Call MD for:  temperature >100.4  As directed    Diet - low sodium heart healthy  As directed     Discharge instructions  As directed    Comments:     Please follow up with Dr Rennis Chris of Orthopedic Surgery in 3-4 weeks Please call [facility] at [phone], to see which doctor is taking new patients and make a follow up appointment in 1-2 weeks   Increase activity slowly  As directed        Medication List         ADDERALL PO  Take by mouth.     HYDROcodone-acetaminophen 5-325 MG per tablet  Commonly known as:  NORCO/VICODIN  Take 1 tablet by mouth every 4 (four) hours as needed (For severe pain).       Allergies  Allergen Reactions  . Penicillins Rash      The results of significant diagnostics from this hospitalization (including imaging, microbiology, ancillary and laboratory) are listed below for reference.    Significant Diagnostic Studies: Dg Chest 2 View  12/31/2012   CLINICAL DATA:  Fall.  Right lateral chest pain.  EXAM: CHEST  2 VIEW  COMPARISON:  Multiple priors, most recent 02/22/2011  FINDINGS: The heart size and mediastinal contours are within normal limits. No focal consolidation or edema. Left apical pleural thickening is unchanged. No pneumothorax or pleural effusion. Displaced right anterior lateral 4th rib fracture is noted. Displaced comminuted fracture of the lateral aspect of the clavicle is noted.  IMPRESSION: 1. Right clavicular fracture. 2. Right 4th rib fracture. No pneumothorax.   Electronically Signed   By: Jerene Dilling M.D.   On: 12/31/2012 15:06   Dg Shoulder Right  12/31/2012   *RADIOLOGY REPORT*  Clinical Data: The patient fell today with right lateral chest pain  RIGHT SHOULDER - 2+ VIEW  Comparison:  None  Findings: There is a fracture through the lateral tip of the right clavicle, with the lateral fracture fragment displaced 14 mm inferolaterally.  The fracture is mildly comminuted.  There is mild degenerative change at the glenohumeral level with no evidence of humerus fracture.  IMPRESSION: Clavicle fracture.   Original Report  Authenticated By: Esperanza Heir, M.D.   Ct Head Wo Contrast - If Head Trauma Is Known/suspected And/or Pt Is Taking Anticoagulant  12/31/2012   CLINICAL DATA:  Fall. Syncopal episode. The patient is amnestic to the event.  EXAM: CT HEAD WITHOUT CONTRAST  CT CERVICAL SPINE WITHOUT CONTRAST  TECHNIQUE: Multidetector CT imaging of the head and cervical spine was performed following the standard protocol without intravenous contrast. Multiplanar CT image reconstructions of the cervical spine were also generated.  COMPARISON:  CT head and cervical 02/28/2011.  FINDINGS: CT HEAD FINDINGS  No acute cortical infarct, hemorrhage, or mass lesion is present. The ventricles are of normal size. No significant extra-axial fluid collection is present.  And the paranasal sinuses and mastoid air cells are clear. The osseous skull is intact.  Soft tissue swelling is evident in the high right frontal scalp. The punctate hyperdense focus likely represents some element of debris.  CT CERVICAL SPINE FINDINGS  The cervical spine is imaged from the skull base through T3-4. Degenerative grade 1 anterolisthesis is again noted at C2-3 with left greater than right facet hypertrophy. Chronic loss of disc height is evident at C3-4,  and C6-7. Multilevel uncovertebral spurring is present. Mild osseous foraminal narrowing is present at multiple levels, greatest on the left at C5-6.  The lung apices are clear. The soft tissues of the neck are unremarkable.  IMPRESSION: CT HEAD IMPRESSION  1. Normal CT appearance of the brain. 2. Soft tissue swelling of the right frontal scalp with probable radiopaque debris along the skin surface.  CT CERVICAL SPINE IMPRESSION  1. Similar appearance of moderate spondylosis throughout cervical spine. 2. No acute fracture or traumatic subluxation.   Electronically Signed   By: Gennette Pac   On: 12/31/2012 14:52   Ct Cervical Spine Wo Contrast  12/31/2012   CLINICAL DATA:  Fall. Syncopal episode. The patient  is amnestic to the event.  EXAM: CT HEAD WITHOUT CONTRAST  CT CERVICAL SPINE WITHOUT CONTRAST  TECHNIQUE: Multidetector CT imaging of the head and cervical spine was performed following the standard protocol without intravenous contrast. Multiplanar CT image reconstructions of the cervical spine were also generated.  COMPARISON:  CT head and cervical 02/28/2011.  FINDINGS: CT HEAD FINDINGS  No acute cortical infarct, hemorrhage, or mass lesion is present. The ventricles are of normal size. No significant extra-axial fluid collection is present.  And the paranasal sinuses and mastoid air cells are clear. The osseous skull is intact.  Soft tissue swelling is evident in the high right frontal scalp. The punctate hyperdense focus likely represents some element of debris.  CT CERVICAL SPINE FINDINGS  The cervical spine is imaged from the skull base through T3-4. Degenerative grade 1 anterolisthesis is again noted at C2-3 with left greater than right facet hypertrophy. Chronic loss of disc height is evident at C3-4, and C6-7. Multilevel uncovertebral spurring is present. Mild osseous foraminal narrowing is present at multiple levels, greatest on the left at C5-6.  The lung apices are clear. The soft tissues of the neck are unremarkable.  IMPRESSION: CT HEAD IMPRESSION  1. Normal CT appearance of the brain. 2. Soft tissue swelling of the right frontal scalp with probable radiopaque debris along the skin surface.  CT CERVICAL SPINE IMPRESSION  1. Similar appearance of moderate spondylosis throughout cervical spine. 2. No acute fracture or traumatic subluxation.   Electronically Signed   By: Gennette Pac   On: 12/31/2012 14:52    Microbiology: No results found for this or any previous visit (from the past 240 hour(s)).   Labs: Basic Metabolic Panel:  Recent Labs Lab 12/31/12 1623 01/01/13 0605  NA 139 137  K 4.3 4.0  CL 103 102  CO2  --  27  GLUCOSE 92 96  BUN 20 17  CREATININE 1.30 1.19  CALCIUM  --   8.9   Liver Function Tests: No results found for this basename: AST, ALT, ALKPHOS, BILITOT, PROT, ALBUMIN,  in the last 168 hours No results found for this basename: LIPASE, AMYLASE,  in the last 168 hours No results found for this basename: AMMONIA,  in the last 168 hours CBC:  Recent Labs Lab 12/31/12 1623 12/31/12 1656 01/01/13 0605  WBC  --  13.4* 8.1  HGB 17.0 16.8 15.7  HCT 50.0 47.9 43.5  MCV  --  89.0 89.1  PLT  --  176 166   Cardiac Enzymes: No results found for this basename: CKTOTAL, CKMB, CKMBINDEX, TROPONINI,  in the last 168 hours BNP: BNP (last 3 results) No results found for this basename: PROBNP,  in the last 8760 hours CBG:  Recent Labs Lab 12/31/12 1333  GLUCAP 102*  SignedJeralyn Bennett  [facility] 01/01/2013, 5:17 PM

## 2013-01-01 NOTE — Care Management Note (Unsigned)
    Page 1 of 1   01/01/2013     4:34:09 PM   CARE MANAGEMENT NOTE 01/01/2013  Patient:  [name] Lambert, [name] Lambert   Account Number:  [id number]  Date Initiated:  01/01/2013  Documentation initiated by:  Daelen Belvedere  Subjective/Objective Assessment:   PT ADM ON 12/31/12 WITH SYNCOPE, FX RIB AND CLAVICLE.  PTA, PT INDEPENDENT, LIVES WITH SPOUSE.     Action/Plan:   WILL FOLLOW FOR DC NEEDS AS PT PROGRESSES.   Anticipated DC Date:  01/02/2013   Anticipated DC Plan:  HOME/SELF CARE      DC Planning Services  CM consult      Choice offered to / List presented to:             Status of service:  In process, will continue to follow Medicare Important Message given?   (If response is "NO", the following Medicare IM given date fields will be blank) Date Medicare IM given:   Date Additional Medicare IM given:    Discharge Disposition:    Per UR Regulation:  Reviewed for med. necessity/level of care/duration of stay  If discussed at Long Length of Stay Meetings, dates discussed:    Comments:

## 2013-01-07 ENCOUNTER — Encounter (HOSPITAL_COMMUNITY): Payer: Self-pay | Admitting: Pharmacy Technician

## 2013-01-07 ENCOUNTER — Encounter (HOSPITAL_COMMUNITY): Payer: Self-pay | Admitting: *Deleted

## 2013-01-07 ENCOUNTER — Inpatient Hospital Stay (HOSPITAL_COMMUNITY)
Admission: RE | Admit: 2013-01-07 | Discharge: 2013-01-07 | Disposition: A | Discharge status: Home / Self Care | Payer: BC Managed Care – PPO | Source: Ambulatory Visit

## 2013-01-07 MED ORDER — DEXTROSE 5 % IV SOLN
3.0000 g | INTRAVENOUS | Status: AC
  Administered 2013-01-08: 3 g via INTRAVENOUS
  Filled 2013-01-07: qty 3000

## 2013-01-07 NOTE — Progress Notes (Signed)
Pt doesn't have a cardiologist  Echo report done on 01-01-13  Denies ever having a stress test or heart cath  Pt doesn't have a medical Md  EKG and CXR in epic from 12-31-12

## 2013-01-07 NOTE — Pre-Procedure Instructions (Signed)
[name] Lambert  01/07/2013   Your procedure is scheduled on:  Tues, Oct 7 @ 1:07 PM  Report to [facility] Stay [facility]trance A at 11:00 AM.  Call this number if you have problems the morning of surgery: [phone]   Remember:   Do not eat food or drink liquids after midnight.   Take these medicines the morning of surgery with A SIP OF WATER: Adderall(Amphetamine-Dextroamphetamine) and Pain Pill(if needed)               No Goody's,BC's,Aleve,Ibuprofen,Aspirin,Fish Oil,or any Herbal Medications   Do not wear jewelry  Do not wear lotions, powders, or colognes. You may wear deodorant.  Men may shave face and neck.  Do not bring valuables to the hospital.  [facility] is not responsible                  for any belongings or valuables.               Contacts, dentures or bridgework may not be worn into surgery.  Leave suitcase in the car. After surgery it may be brought to your room.  For patients admitted to the hospital, discharge time is determined by your                treatment team.               Patients discharged the day of surgery will not be allowed to drive  home.  Special Instructions: Shower using CHG 2 nights before surgery and the night before surgery.  If you shower the day of surgery use CHG.  Use special wash - you have one bottle of CHG for all showers.  You should use approximately 1/3 of the bottle for each shower.   Please read over the following fact sheets that you were given: Pain Booklet, Coughing and Deep Breathing and Surgical Site Infection Prevention

## 2013-01-08 ENCOUNTER — Encounter (HOSPITAL_COMMUNITY)
Admission: RE | Disposition: A | Discharge status: Home / Self Care | Payer: Self-pay | Source: Ambulatory Visit | Attending: Orthopedic Surgery

## 2013-01-08 ENCOUNTER — Ambulatory Visit (HOSPITAL_COMMUNITY): Payer: BC Managed Care – PPO | Admitting: Anesthesiology

## 2013-01-08 ENCOUNTER — Ambulatory Visit (HOSPITAL_COMMUNITY): Payer: BC Managed Care – PPO

## 2013-01-08 ENCOUNTER — Ambulatory Visit (HOSPITAL_COMMUNITY)
Admission: RE | Admit: 2013-01-08 | Discharge: 2013-01-08 | Disposition: A | Discharge status: Home / Self Care | Payer: BC Managed Care – PPO | Source: Ambulatory Visit | Attending: Orthopedic Surgery | Admitting: Orthopedic Surgery

## 2013-01-08 ENCOUNTER — Encounter (HOSPITAL_COMMUNITY): Payer: Self-pay | Admitting: *Deleted

## 2013-01-08 ENCOUNTER — Encounter (HOSPITAL_COMMUNITY): Payer: Self-pay | Admitting: Anesthesiology

## 2013-01-08 DIAGNOSIS — S2249XA Multiple fractures of ribs, unspecified side, initial encounter for closed fracture: Secondary | ICD-10-CM | POA: Insufficient documentation

## 2013-01-08 DIAGNOSIS — Z79899 Other long term (current) drug therapy: Secondary | ICD-10-CM | POA: Insufficient documentation

## 2013-01-08 DIAGNOSIS — F988 Other specified behavioral and emotional disorders with onset usually occurring in childhood and adolescence: Secondary | ICD-10-CM | POA: Insufficient documentation

## 2013-01-08 DIAGNOSIS — IMO0002 Reserved for concepts with insufficient information to code with codable children: Secondary | ICD-10-CM | POA: Insufficient documentation

## 2013-01-08 DIAGNOSIS — S42033A Displaced fracture of lateral end of unspecified clavicle, initial encounter for closed fracture: Secondary | ICD-10-CM | POA: Insufficient documentation

## 2013-01-08 HISTORY — DX: Other specified behavioral and emotional disorders with onset usually occurring in childhood and adolescence: F98.8

## 2013-01-08 HISTORY — PX: ORIF CLAVICULAR FRACTURE: SHX5055

## 2013-01-08 HISTORY — DX: Pain in unspecified joint: M25.50

## 2013-01-08 HISTORY — DX: Effusion, unspecified joint: M25.40

## 2013-01-08 SURGERY — OPEN REDUCTION INTERNAL FIXATION (ORIF) CLAVICULAR FRACTURE
Anesthesia: General | Site: Shoulder | Laterality: Right | Wound class: Clean

## 2013-01-08 MED ORDER — ONDANSETRON HCL 4 MG/2ML IJ SOLN
INTRAMUSCULAR | Status: DC | PRN
  Administered 2013-01-08: 4 mg via INTRAMUSCULAR

## 2013-01-08 MED ORDER — EPHEDRINE SULFATE 50 MG/ML IJ SOLN
INTRAMUSCULAR | Status: DC | PRN
  Administered 2013-01-08 (×2): 10 mg via INTRAVENOUS

## 2013-01-08 MED ORDER — PHENYLEPHRINE HCL 10 MG/ML IJ SOLN
10.0000 mg | INTRAMUSCULAR | Status: DC | PRN
  Administered 2013-01-08: 50 ug/min via INTRAVENOUS

## 2013-01-08 MED ORDER — ARTIFICIAL TEARS OP OINT
TOPICAL_OINTMENT | OPHTHALMIC | Status: DC | PRN
  Administered 2013-01-08: 1 via OPHTHALMIC

## 2013-01-08 MED ORDER — LIDOCAINE HCL (CARDIAC) 20 MG/ML IV SOLN
INTRAVENOUS | Status: DC | PRN
  Administered 2013-01-08: 100 mg via INTRAVENOUS

## 2013-01-08 MED ORDER — ROCURONIUM BROMIDE 100 MG/10ML IV SOLN
INTRAVENOUS | Status: DC | PRN
  Administered 2013-01-08: 50 mg via INTRAVENOUS

## 2013-01-08 MED ORDER — OXYCODONE-ACETAMINOPHEN 5-325 MG PO TABS
2.0000 | ORAL_TABLET | Freq: Once | ORAL | Status: AC
  Administered 2013-01-08: 2 via ORAL

## 2013-01-08 MED ORDER — GLYCOPYRROLATE 0.2 MG/ML IJ SOLN
INTRAMUSCULAR | Status: DC | PRN
  Administered 2013-01-08: 0.6 mg via INTRAVENOUS

## 2013-01-08 MED ORDER — OXYCODONE HCL 5 MG/5ML PO SOLN: 5.0000 mg | Freq: Once | ORAL | Status: DC | PRN

## 2013-01-08 MED ORDER — BUPIVACAINE-EPINEPHRINE PF 0.5-1:200000 % IJ SOLN
INTRAMUSCULAR | Status: DC | PRN
  Administered 2013-01-08: 30 mL

## 2013-01-08 MED ORDER — CHLORHEXIDINE GLUCONATE 4 % EX LIQD: 60.0000 mL | Freq: Once | CUTANEOUS | Status: DC

## 2013-01-08 MED ORDER — OXYCODONE-ACETAMINOPHEN 5-325 MG PO TABS: 1.0000 | ORAL_TABLET | ORAL | Status: DC | PRN

## 2013-01-08 MED ORDER — PHENYLEPHRINE HCL 10 MG/ML IJ SOLN
INTRAMUSCULAR | Status: DC | PRN
  Administered 2013-01-08 (×2): 120 ug via INTRAVENOUS
  Administered 2013-01-08: 160 ug via INTRAVENOUS

## 2013-01-08 MED ORDER — FENTANYL CITRATE 0.05 MG/ML IJ SOLN
INTRAMUSCULAR | Status: DC | PRN
  Administered 2013-01-08 (×2): 100 ug via INTRAVENOUS

## 2013-01-08 MED ORDER — NEOSTIGMINE METHYLSULFATE 1 MG/ML IJ SOLN
INTRAMUSCULAR | Status: DC | PRN
  Administered 2013-01-08: 4 mg via INTRAVENOUS

## 2013-01-08 MED ORDER — LACTATED RINGERS IV SOLN
INTRAVENOUS | Status: DC
  Administered 2013-01-08: 11:00:00 via INTRAVENOUS

## 2013-01-08 MED ORDER — PROPOFOL 10 MG/ML IV BOLUS
INTRAVENOUS | Status: DC | PRN
  Administered 2013-01-08: 110 mg via INTRAVENOUS

## 2013-01-08 MED ORDER — ONDANSETRON HCL 4 MG/2ML IJ SOLN: 4.0000 mg | Freq: Four times a day (QID) | INTRAMUSCULAR | Status: DC | PRN

## 2013-01-08 MED ORDER — OXYCODONE HCL 5 MG PO TABS: 5.0000 mg | ORAL_TABLET | Freq: Once | ORAL | Status: DC | PRN

## 2013-01-08 MED ORDER — 0.9 % SODIUM CHLORIDE (POUR BTL) OPTIME
TOPICAL | Status: DC | PRN
  Administered 2013-01-08: 1000 mL

## 2013-01-08 MED ORDER — DEXAMETHASONE SODIUM PHOSPHATE 4 MG/ML IJ SOLN
INTRAMUSCULAR | Status: DC | PRN
  Administered 2013-01-08: 4 mg

## 2013-01-08 MED ORDER — MIDAZOLAM HCL 5 MG/5ML IJ SOLN
INTRAMUSCULAR | Status: DC | PRN
  Administered 2013-01-08: 2 mg via INTRAVENOUS

## 2013-01-08 MED ORDER — HYDROMORPHONE HCL PF 1 MG/ML IJ SOLN: 0.2500 mg | INTRAMUSCULAR | Status: DC | PRN

## 2013-01-08 MED ORDER — OXYCODONE-ACETAMINOPHEN 5-325 MG PO TABS
ORAL_TABLET | ORAL | Status: AC
  Administered 2013-01-08: 2 via ORAL
  Filled 2013-01-08: qty 2

## 2013-01-08 SURGICAL SUPPLY — 45 items
CLOSURE STERI-STRIP 1/4X4 (GAUZE/BANDAGES/DRESSINGS) ×2 IMPLANT
CLOTH BEACON ORANGE TIMEOUT ST (SAFETY) ×2 IMPLANT
COVER SURGICAL LIGHT HANDLE (MISCELLANEOUS) ×2 IMPLANT
DRAPE C-ARM 42X72 X-RAY (DRAPES) ×2 IMPLANT
DRAPE INCISE IOBAN 66X45 STRL (DRAPES) ×2 IMPLANT
DRAPE ORTHO SPLIT 77X108 STRL (DRAPES) ×1
DRAPE SURG 17X23 STRL (DRAPES) ×2 IMPLANT
DRAPE SURG ORHT 6 SPLT 77X108 (DRAPES) ×1 IMPLANT
DRAPE U-SHAPE 47X51 STRL (DRAPES) ×4 IMPLANT
DRSG EMULSION OIL 3X3 NADH (GAUZE/BANDAGES/DRESSINGS) ×2 IMPLANT
DRSG MEPILEX BORDER 4X8 (GAUZE/BANDAGES/DRESSINGS) ×2 IMPLANT
ELECT REM PT RETURN 9FT ADLT (ELECTROSURGICAL) ×2
ELECTRODE REM PT RTRN 9FT ADLT (ELECTROSURGICAL) ×1 IMPLANT
GLOVE BIO SURGEON STRL SZ7.5 (GLOVE) ×2 IMPLANT
GLOVE BIO SURGEON STRL SZ8 (GLOVE) ×2 IMPLANT
GLOVE EUDERMIC 7 POWDERFREE (GLOVE) ×2 IMPLANT
GLOVE SS BIOGEL STRL SZ 7.5 (GLOVE) ×1 IMPLANT
GLOVE SUPERSENSE BIOGEL SZ 7.5 (GLOVE) ×1
GOWN STRL NON-REIN LRG LVL3 (GOWN DISPOSABLE) ×2 IMPLANT
GOWN STRL REIN XL XLG (GOWN DISPOSABLE) ×4 IMPLANT
KIT AC JOINT DISP (KITS) ×2 IMPLANT
KIT BASIN OR (CUSTOM PROCEDURE TRAY) ×2 IMPLANT
KIT ROOM TURNOVER OR (KITS) ×2 IMPLANT
MANIFOLD NEPTUNE II (INSTRUMENTS) ×2 IMPLANT
NEEDLE HYPO 25GX1X1/2 BEV (NEEDLE) ×2 IMPLANT
NS IRRIG 1000ML POUR BTL (IV SOLUTION) ×2 IMPLANT
PACK SHOULDER (CUSTOM PROCEDURE TRAY) ×2 IMPLANT
PAD ARMBOARD 7.5X6 YLW CONV (MISCELLANEOUS) ×4 IMPLANT
SLING ARM FOAM STRAP LRG (SOFTGOODS) ×2 IMPLANT
SPONGE GAUZE 4X4 12PLY (GAUZE/BANDAGES/DRESSINGS) ×2 IMPLANT
SPONGE LAP 18X18 X RAY DECT (DISPOSABLE) ×4 IMPLANT
SPONGE LAP 4X18 X RAY DECT (DISPOSABLE) ×4 IMPLANT
STAPLER VISISTAT 35W (STAPLE) ×2 IMPLANT
STRIP CLOSURE SKIN 1/2X4 (GAUZE/BANDAGES/DRESSINGS) ×2 IMPLANT
SUCTION FRAZIER TIP 10 FR DISP (SUCTIONS) ×2 IMPLANT
SUT MNCRL AB 4-0 PS2 18 (SUTURE) ×2 IMPLANT
SUT VIC AB 2-0 CT1 27 (SUTURE) ×1
SUT VIC AB 2-0 CT1 TAPERPNT 27 (SUTURE) ×1 IMPLANT
SUT VICRYL 0 CT 1 36IN (SUTURE) ×2 IMPLANT
SYR CONTROL 10ML LL (SYRINGE) ×2 IMPLANT
TOWEL OR 17X24 6PK STRL BLUE (TOWEL DISPOSABLE) ×2 IMPLANT
TOWEL OR 17X26 10 PK STRL BLUE (TOWEL DISPOSABLE) ×2 IMPLANT
WATER STERILE IRR 1000ML POUR (IV SOLUTION) ×2 IMPLANT
YANKAUER SUCT BULB TIP NO VENT (SUCTIONS) ×2 IMPLANT
ZIPLOOP AC JOINT REPAIR (Orthopedic Implant) ×2 IMPLANT

## 2013-01-08 NOTE — Anesthesia Postprocedure Evaluation (Signed)
Anesthesia Post Note  Patient: [name] Lambert  Procedure(s) Performed: Procedure(s) (LRB): OPEN REDUCTION INTERNAL FIXATION (ORIF) RIGHT  DISTAL CLAVICULAR FRACTURE (Right)  Anesthesia type: general  Patient location: PACU  Post pain: Pain level controlled  Post assessment: Patient's Cardiovascular Status Stable  Last Vitals:  Filed Vitals:   01/08/13 1550  BP: 142/60  Pulse: 78  Temp: 36.4 C  Resp:     Post vital signs: Reviewed and stable  Level of consciousness: sedated  Complications: No apparent anesthesia complications
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
## 2013-01-08 NOTE — Preoperative (Signed)
Beta Blockers   Reason not to administer Beta Blockers:Not Applicable 

## 2013-01-08 NOTE — Transfer of Care (Signed)
Immediate Anesthesia Transfer of Care Note  Patient: [name] Lambert  Procedure(s) Performed: Procedure(s): OPEN REDUCTION INTERNAL FIXATION (ORIF) RIGHT  DISTAL CLAVICULAR FRACTURE (Right)  Patient Location: PACU  Anesthesia Type:General  Level of Consciousness: lethargic and responds to stimulation  Airway & Oxygen Therapy: Patient Spontanous Breathing and Patient connected to nasal cannula oxygen  Post-op Assessment: Report given to PACU RN  Post vital signs: Reviewed and stable  Complications: No apparent anesthesia complications

## 2013-01-08 NOTE — Anesthesia Preprocedure Evaluation (Addendum)
Anesthesia Evaluation  Patient identified by MRN, date of birth, ID band Patient awake    Reviewed: Allergy & Precautions, H&P , NPO status , Patient's Chart, lab work & pertinent test results  Airway Mallampati: II TM Distance: >3 FB Neck ROM: full    Dental  (+) Teeth Intact and Dental Advisory Given   Pulmonary neg pulmonary ROS,          Cardiovascular negative cardio ROS  Rhythm:Regular Rate:Normal     Neuro/Psych ADD   GI/Hepatic   Endo/Other    Renal/GU      Musculoskeletal   Abdominal   Peds  Hematology   Anesthesia Other Findings   Reproductive/Obstetrics                         Anesthesia Physical Anesthesia Plan  ASA: II  Anesthesia Plan: General   Post-op Pain Management:    Induction: Intravenous  Airway Management Planned: Oral ETT  Additional Equipment:   Intra-op Plan:   Post-operative Plan: Extubation in OR  Informed Consent: I have reviewed the patients History and Physical, chart, labs and discussed the procedure including the risks, benefits and alternatives for the proposed anesthesia with the patient or authorized representative who has indicated his/her understanding and acceptance.     Plan Discussed with: CRNA and Surgeon  Anesthesia Plan Comments:        Anesthesia Quick Evaluation

## 2013-01-08 NOTE — Op Note (Signed)
01/08/2013  2:35 PM  PATIENT:   [name] Lambert  57 y.o. male  PRE-OPERATIVE DIAGNOSIS:  RIGHT DISTAL CLAVICLE FRACTURE  POST-OPERATIVE DIAGNOSIS:  Same  PROCEDURE:  ORIF with zip-loop coraco-clavicular suture construct  SURGEON:  Livia Tarr, Vania Rea M.D.  ASSISTANTS: Shuford pac   ANESTHESIA:   GET + ISB  EBL: mininmal  SPECIMEN:  none  Drains: none   PATIENT DISPOSITION:  PACU - hemodynamically stable.    PLAN OF CARE: Discharge to home after PACU  Dictation# [id number]

## 2013-01-08 NOTE — Anesthesia Procedure Notes (Addendum)
Anesthesia Regional Block:  Interscalene brachial plexus block  Pre-Anesthetic Checklist: ,, timeout performed, Correct Patient, Correct Site, Correct Laterality, Correct Procedure, Correct Position, site marked, Risks and benefits discussed,  Surgical consent,  Pre-op evaluation,  At surgeon's request and post-op pain management  Laterality: Right  Prep: chloraprep       Needles:  Injection technique: Single-shot  Needle Type: Echogenic Stimulator Needle     Needle Length: 5cm 5 cm Needle Gauge: 21 and 21 G    Additional Needles:  Procedures: ultrasound guided (picture in chart) and nerve stimulator Interscalene brachial plexus block  Nerve Stimulator or Paresthesia:  Response: 0.4 mA,   Additional Responses:   Narrative:  Start time: 01/08/2013 12:20 PM End time: 01/08/2013 12:32 PM Injection made incrementally with aspirations every 5 mL.  Performed by: Personally  Anesthesiologist: Arta Bruce MD  Additional Notes: Monitors applied. Patient sedated. Sterile prep and drape,hand hygiene and sterile gloves were used. Relevant anatomy identified.Needle position confirmed.Local anesthetic injected incrementally after negative aspiration. Local anesthetic spread visualized around nerve(s). Vascular puncture avoided. No complications. Image printed for medical record.The patient tolerated the procedure well.       Interscalene brachial plexus block Procedure Name: Intubation Date/Time: 01/08/2013 1:20 PM Performed by: Jefm Miles E Pre-anesthesia Checklist: Patient identified, Emergency Drugs available, Suction available, Patient being monitored and Timeout performed Patient Re-evaluated:Patient Re-evaluated prior to inductionOxygen Delivery Method: Circle system utilized Preoxygenation: Pre-oxygenation with 100% oxygen Intubation Type: IV induction Ventilation: Mask ventilation without difficulty Laryngoscope Size: Mac and 3 Grade View: Grade III Tube type:  Oral Tube size: 7.5 mm Number of attempts: 1 Airway Equipment and Method: Stylet Placement Confirmation: ETT inserted through vocal cords under direct vision,  positive ETCO2 and breath sounds checked- equal and bilateral Secured at: 22 cm Tube secured with: Tape Dental Injury: Teeth and Oropharynx as per pre-operative assessment

## 2013-01-09 NOTE — Op Note (Signed)
[name] Lambert, [name] Lambert NO.:  [id number]  MEDICAL RECORD NO.:  [id number]  LOCATION:  [facility]                         FACILITY:  [facility]  PHYSICIAN:  Vania Rea. Tashala Cumbo, M.D.  DATE OF BIRTH:  07-08-1955  DATE OF PROCEDURE:  01/08/2013 DATE OF DISCHARGE:  01/08/2013                              OPERATIVE REPORT   PREOPERATIVE DIAGNOSIS:  Displaced right distal clavicle fracture with disruption of the coracoclavicular ligaments.  POSTOPERATIVE DIAGNOSIS:  Displaced right distal clavicle fracture with disruption of the coracoclavicular ligaments.  PROCEDURE:  Open reduction and internal fixation of right distal clavicle fracture using a zip loop coracoclavicular suture construct.  SURGEON:  Vania Rea. Drishti Pepperman, M.D.  Threasa HeadsFrench Ana A. Shuford, PA-C.  ANESTHESIA:  General endotracheal as well as an interscalene block.  ESTIMATED BLOOD LOSS:  Minimal.  DRAINS:  None.  HISTORY:  Mr. Bartholomew is a 57 year old gentleman who fell last week injuring his right shoulder and sustaining a markedly displaced right distal clavicle fracture with disruption of the coracoclavicular ligaments and marked displacement of the medial clavicular segment.  On initial consultation, he had declined surgical intervention, but followed up in the office this week after reconsidering his options and now desires proceeding with open reduction and internal fixation.  Preoperatively, we counseled Mr. Kudrna on treatment options as well as risks versus benefits thereof.  Possible surgical complications were reviewed including the potential for bleeding, infection, neurovascular injury, persistent pain, loss of motion, external rotation, malunion, nonunion, loss of fixation, possible need for additional surgery.  He understands and accepts and agrees to planned procedure.  PROCEDURE IN DETAIL:  After undergoing routine preop evaluation, the patient received prophylactic antibiotics and a  interscalene block was established in the holding area by the Anesthesia Department.  Placed supine on the operating table, underwent smooth induction of a general endotracheal anesthesia.  Placed in the beach-chair position and appropriately padded and protected.  The right shoulder girdle region was sterilely prepped and draped in standard fashion.  Time-out was called.  I made a transverse 6 cm incision over the distal clavicle overlying the fracture site.  Skin flaps were elevated.  Dissection carried deeply and subperiosteal dissection was used to expose the fracture site, and the medial segment of the clavicle and overlying the distal clavicle towards the [facility] joint.  I would initially consider placing a distal clavicle plate, but the segment of clavicle laterally/distally was very comminuted and a much smaller than I had hoped and did not show the amount of bone that would allow me to gain the stability of the construct that would help with the plate and so we elected to proceed with a coracoclavicular suture construct.  We placed a guide pin through the distal clavicle, medial to the fracture site, allowing a good rim of solid cortical bone and directly overlying the coracoid.  This guide pin was placed and this was drilled with a standard reamer to the appropriate diameter.  In a similar fashion, I passed a guide pin through the coracoid process, and overdrilled this with a reamer and care taken to confirm proper positioning of our tunnels.  We then passed the zip loop  through the clavicle and then down through the coracoid and confirmed that the "button" had flipped.  The dog bone was then placed over the apex of the clavicle and the zip loop was then tightened allowing reduction of the clavicle with excellent position and reapproximation.  A final fluoroscopic images showed good alignment at the fracture site.  Good position of the construct and overall we were very satisfied  with the overall construct stability. Sutures were then clipped.  The wounds were irrigated and hemostasis was obtained.  I closed with a series of interrupted #1 Vicryl for the deep fascia and periosteal layers, 2-0 Vicryl with the subcu and intracuticular 3-0 Monocryl for the skin, followed by Steri-Strips.  Dry dressings were applied.  Right arm was placed in a sling.  The patient was awakened, extubated, and taken to the recovery room in stable condition.     Vania Rea. Arlean Thies, M.D.     KMS/MEDQ  D:  01/08/2013  T:  01/09/2013  Job:  [id number]

## 2013-01-11 ENCOUNTER — Encounter (HOSPITAL_COMMUNITY): Payer: Self-pay | Admitting: Orthopedic Surgery

## 2013-02-07 ENCOUNTER — Other Ambulatory Visit: Payer: Self-pay

## 2017-06-12 ENCOUNTER — Telehealth: Payer: Self-pay | Admitting: Internal Medicine

## 2017-06-12 ENCOUNTER — Other Ambulatory Visit: Payer: Self-pay | Admitting: Internal Medicine

## 2017-06-12 NOTE — Telephone Encounter (Signed)
Yes, I will see him.

## 2017-06-12 NOTE — Telephone Encounter (Signed)
States that Dr. Kriste BasqueNadel referred Dr. Yetta BarreJones as an MD to establish care.  Please advise.

## 2017-06-20 NOTE — Telephone Encounter (Signed)
Patient is scheduled.  Patient has a duplicate chart  This has been submitted for merge.

## 2017-07-06 ENCOUNTER — Other Ambulatory Visit (INDEPENDENT_AMBULATORY_CARE_PROVIDER_SITE_OTHER): Payer: PRIVATE HEALTH INSURANCE

## 2017-07-06 ENCOUNTER — Encounter: Payer: Self-pay | Admitting: Internal Medicine

## 2017-07-06 ENCOUNTER — Ambulatory Visit (INDEPENDENT_AMBULATORY_CARE_PROVIDER_SITE_OTHER): Payer: PRIVATE HEALTH INSURANCE | Admitting: Internal Medicine

## 2017-07-06 VITALS — BP 142/92 | HR 84 | Temp 98.6°F | Resp 16 | Ht 69.0 in | Wt 184.1 lb

## 2017-07-06 DIAGNOSIS — B356 Tinea cruris: Secondary | ICD-10-CM | POA: Insufficient documentation

## 2017-07-06 DIAGNOSIS — Z1211 Encounter for screening for malignant neoplasm of colon: Secondary | ICD-10-CM

## 2017-07-06 DIAGNOSIS — E785 Hyperlipidemia, unspecified: Secondary | ICD-10-CM

## 2017-07-06 DIAGNOSIS — Z1159 Encounter for screening for other viral diseases: Secondary | ICD-10-CM

## 2017-07-06 DIAGNOSIS — K4020 Bilateral inguinal hernia, without obstruction or gangrene, not specified as recurrent: Secondary | ICD-10-CM

## 2017-07-06 DIAGNOSIS — I10 Essential (primary) hypertension: Secondary | ICD-10-CM | POA: Insufficient documentation

## 2017-07-06 DIAGNOSIS — Z23 Encounter for immunization: Secondary | ICD-10-CM

## 2017-07-06 DIAGNOSIS — Z0001 Encounter for general adult medical examination with abnormal findings: Secondary | ICD-10-CM | POA: Diagnosis not present

## 2017-07-06 DIAGNOSIS — Z0000 Encounter for general adult medical examination without abnormal findings: Secondary | ICD-10-CM

## 2017-07-06 LAB — URINALYSIS, ROUTINE W REFLEX MICROSCOPIC
Bilirubin Urine: NEGATIVE
KETONES UR: NEGATIVE
Leukocytes, UA: NEGATIVE
NITRITE: NEGATIVE
PH: 6 (ref 5.0–8.0)
Specific Gravity, Urine: >=1.03 — AB (ref 1.000–1.030)
TOTAL PROTEIN, URINE-UPE24: NEGATIVE
Urine Glucose: NEGATIVE
Urobilinogen, UA: 0.2 (ref 0.0–1.0)

## 2017-07-06 LAB — CBC WITH DIFFERENTIAL/PLATELET
Basophils Absolute: 0 10*3/uL (ref 0.0–0.1)
Basophils Relative: 0.7 % (ref 0.0–3.0)
EOS PCT: 5.8 % — AB (ref 0.0–5.0)
Eosinophils Absolute: 0.3 10*3/uL (ref 0.0–0.7)
HCT: 45.9 % (ref 39.0–52.0)
Hemoglobin: 16 g/dL (ref 13.0–17.0)
LYMPHS ABS: 1.1 10*3/uL (ref 0.7–4.0)
Lymphocytes Relative: 19.1 % (ref 12.0–46.0)
MCHC: 35 g/dL (ref 30.0–36.0)
MCV: 89.9 fl (ref 78.0–100.0)
MONO ABS: 0.6 10*3/uL (ref 0.1–1.0)
Monocytes Relative: 10.1 % (ref 3.0–12.0)
NEUTROS PCT: 64.3 % (ref 43.0–77.0)
Neutro Abs: 3.7 10*3/uL (ref 1.4–7.7)
PLATELETS: 201 10*3/uL (ref 150.0–400.0)
RBC: 5.1 Mil/uL (ref 4.22–5.81)
RDW: 13.1 % (ref 11.5–15.5)
WBC: 5.7 10*3/uL (ref 4.0–10.5)

## 2017-07-06 MED ORDER — CICLOPIROX 0.77 % EX GEL: 1.0000 | Freq: Two times a day (BID) | CUTANEOUS | 2 refills | Status: DC

## 2017-07-06 NOTE — Progress Notes (Signed)
Subjective:  Patient ID: [name] Lambert, male    DOB: June 13, 1955  Age: 62 y.o. MRN: [id number]  CC: Annual Exam; Hypertension; Hyperlipidemia; and Rash  NEW TO ME  HPI FREDDIE DYMEK presents for a CPX.  He is very active and denies any recent episodes of CP, DOE, palpitations, edema, or fatigue.  He has a rash in his groin that does not bother him.  History [name] has a past medical history of ADD (attention deficit disorder), Joint pain, Joint swelling, Multiple rib fractures, and Syncope and collapse (12/31/2012).   He has a past surgical history that includes Tonsillectomy (1950's); Appendectomy (2009); and ORIF clavicular fracture (Right, 01/08/2013).   His family history is not on file.He reports that he has never smoked. He has never used smokeless tobacco. He reports that he does not drink alcohol or use drugs.  Outpatient Medications Prior to Visit  Medication Sig Dispense Refill  . Amphetamine-Dextroamphetamine (ADDERALL PO) Take 1 tablet by mouth daily.     Marland Kitchen oxyCODONE-acetaminophen (PERCOCET) 5-325 MG per tablet Take 1-2 tablets by mouth every 4 (four) hours as needed for pain. 40 tablet 0   No facility-administered medications prior to visit.     ROS Review of Systems  Constitutional: Negative.  Negative for diaphoresis, fatigue and unexpected weight change.  HENT: Negative.  Negative for trouble swallowing.   Eyes: Negative for visual disturbance.  Respiratory: Negative for cough, chest tightness, shortness of breath and wheezing.   Cardiovascular: Negative for chest pain, palpitations and leg swelling.  Gastrointestinal: Negative for abdominal pain, diarrhea, nausea and vomiting.  Endocrine: Negative.   Genitourinary: Negative.  Negative for difficulty urinating, penile swelling, scrotal swelling, testicular pain and urgency.  Musculoskeletal: Negative.  Negative for arthralgias, back pain, myalgias and neck pain.  Skin: Positive for color change and rash. Negative  for pallor.  Allergic/Immunologic: Negative.   Neurological: Negative.  Negative for dizziness, weakness, light-headedness and numbness.  Hematological: Negative for adenopathy. Does not bruise/bleed easily.  Psychiatric/Behavioral: Negative.     Objective:  BP (!) 142/92 (BP Location: Left Arm, Patient Position: Sitting, Cuff Size: Large)   Pulse 84   Temp 98.6 F (37 C) (Oral)   Resp 16   Ht 5\' 9"  (1.753 m)   Wt 184 lb 1.9 oz (83.5 kg)   SpO2 98%   BMI 27.19 kg/m   Physical Exam  Constitutional: He is oriented to person, place, and time. No distress.  HENT:  Mouth/Throat: Oropharynx is clear and moist. No oropharyngeal exudate.  Eyes: Conjunctivae are normal. Right eye exhibits no discharge. Left eye exhibits no discharge. No scleral icterus.  Neck: Normal range of motion. Neck supple. No JVD present. No thyromegaly present.  Cardiovascular: Normal rate, regular rhythm and normal heart sounds. Exam reveals no gallop and no friction rub.  No murmur heard. EKG ---  Sinus  Rhythm  -Prominent R(V1) -nonspecific.   BORDERLINE- no change from the prior EKG   Pulmonary/Chest: Effort normal and breath sounds normal. No respiratory distress. He has no wheezes. He has no rales.  Abdominal: Soft. Bowel sounds are normal. He exhibits no distension and no mass. There is no tenderness. There is no guarding. Hernia confirmed negative in the right inguinal area and confirmed negative in the left inguinal area.  Genitourinary: Testes normal and penis normal.    Right testis shows no mass, no swelling and no tenderness. Left testis shows no mass, no swelling and no tenderness. Uncircumcised. No phimosis, paraphimosis, hypospadias, penile  erythema or penile tenderness. No discharge found.  Musculoskeletal: Normal range of motion. He exhibits no edema, tenderness or deformity.  Lymphadenopathy:    He has no cervical adenopathy.       Right: No inguinal adenopathy present.       Left: No  inguinal adenopathy present.  Neurological: He is alert and oriented to person, place, and time.  Skin: Skin is warm and dry. No rash noted. He is not diaphoretic. No erythema. No pallor.  Psychiatric: He has a normal mood and affect. His behavior is normal. Judgment and thought content normal.  Vitals reviewed.   Lab Results  Component Value Date   WBC 5.7 07/06/2017   HGB 16.0 07/06/2017   HCT 45.9 07/06/2017   PLT 201.0 07/06/2017   GLUCOSE 82 07/06/2017   CHOL 223 (H) 07/06/2017   TRIG 210.0 (H) 07/06/2017   HDL 37.50 (L) 07/06/2017   LDLDIRECT 140.0 07/06/2017   ALT 22 07/06/2017   AST 24 07/06/2017   NA 141 07/06/2017   K 4.1 07/06/2017   CL 103 07/06/2017   CREATININE 1.40 07/06/2017   BUN 24 (H) 07/06/2017   CO2 25 07/06/2017   TSH 2.52 07/06/2017   PSA 1.94 07/06/2017    Assessment & Plan:   [name] was seen today for annual exam, hypertension, hyperlipidemia and rash.  Diagnoses and all orders for this visit:  Routine general medical examination at a health care facility- Exam completed, labs reviewed, vaccines reviewed and updated, a cologuard was ordered to screen for colon cancer/polyps, patient education material was given. -     Lipid panel; Future -     PSA; Future -     HIV antibody; Future  Need for hepatitis C screening test -     Hepatitis C antibody; Future  Hyperlipidemia with target LDL less than 130- He has an elevated ASCVD risk score so I have asked him to start a statin for CV risk reduction. -     Comprehensive metabolic panel; Future -     rosuvastatin (CRESTOR) 10 MG tablet; Take 1 tablet (10 mg total) by mouth daily.  Hypertension, unspecified type- He has new onset, stage I hypertension.  His EKG is negative for LVH.  His lab work is negative for secondary causes or endorgan damage.  He does not want to start on antihypertensives but does agree to work on his lifestyle modifications.  I have asked him to return in about 3 months for me to  recheck his blood pressure. -     EKG 12-Lead -     CBC with Differential/Platelet; Future -     Thyroid Panel With TSH; Future -     Urinalysis, Routine w reflex microscopic; Future  Tinea cruris -     Ciclopirox 0.77 % gel; Apply 1 Act topically 2 (two) times daily.  Bilateral inguinal hernia without obstruction or gangrene, recurrence not specified -     Ambulatory referral to General Surgery  Need for Tdap vaccination -     Tdap vaccine greater than or equal to 7yo IM  Colon cancer screening -     Cologuard   I have discontinued [name] [place]unton "Mike"'s Amphetamine-Dextroamphetamine (ADDERALL PO) and oxyCODONE-acetaminophen. I am also having him start on Ciclopirox and rosuvastatin.  Meds ordered this encounter  Medications  . Ciclopirox 0.77 % gel    Sig: Apply 1 Act topically 2 (two) times daily.    Dispense:  100 g    Refill:  2  . rosuvastatin (CRESTOR) 10 MG tablet    Sig: Take 1 tablet (10 mg total) by mouth daily.    Dispense:  90 tablet    Refill:  1     Follow-up: Return in about 3 months (around 10/05/2017).  Sanda Lingerhomas Danyle Boening, MD

## 2017-07-06 NOTE — Patient Instructions (Signed)

## 2017-07-07 ENCOUNTER — Encounter: Payer: Self-pay | Admitting: Internal Medicine

## 2017-07-07 LAB — COMPREHENSIVE METABOLIC PANEL
ALT: 22 U/L (ref 0–53)
AST: 24 U/L (ref 0–37)
Albumin: 4.5 g/dL (ref 3.5–5.2)
Alkaline Phosphatase: 64 U/L (ref 39–117)
BUN: 24 mg/dL — ABNORMAL HIGH (ref 6–23)
CALCIUM: 9.6 mg/dL (ref 8.4–10.5)
CO2: 25 mEq/L (ref 19–32)
Chloride: 103 mEq/L (ref 96–112)
Creatinine, Ser: 1.4 mg/dL (ref 0.40–1.50)
GFR: 54.31 mL/min — AB (ref >60.00)
Glucose, Bld: 82 mg/dL (ref 70–99)
POTASSIUM: 4.1 meq/L (ref 3.5–5.1)
Sodium: 141 mEq/L (ref 135–145)
Total Bilirubin: 0.7 mg/dL (ref 0.2–1.2)
Total Protein: 6.8 g/dL (ref 6.0–8.3)

## 2017-07-07 LAB — LDL CHOLESTEROL, DIRECT: Direct LDL: 140 mg/dL

## 2017-07-07 LAB — THYROID PANEL WITH TSH
FREE THYROXINE INDEX: 1.7 (ref 1.4–3.8)
T3 UPTAKE: 29 % (ref 22–35)
T4, Total: 5.9 ug/dL (ref 4.9–10.5)
TSH: 2.52 mIU/L (ref 0.40–4.50)

## 2017-07-07 LAB — LIPID PANEL
Cholesterol: 223 mg/dL — ABNORMAL HIGH (ref 0–200)
HDL: 37.5 mg/dL — ABNORMAL LOW (ref >39.00)
NonHDL: 185.3
TRIGLYCERIDES: 210 mg/dL — AB (ref 0.0–149.0)
Total CHOL/HDL Ratio: 6
VLDL: 42 mg/dL — AB (ref 0.0–40.0)

## 2017-07-07 LAB — HIV ANTIBODY (ROUTINE TESTING W REFLEX): HIV 1&2 Ab, 4th Generation: NONREACTIVE

## 2017-07-07 LAB — HEPATITIS C ANTIBODY
Hepatitis C Ab: NONREACTIVE
SIGNAL TO CUT-OFF: 0.02 (ref <1.00)

## 2017-07-07 LAB — PSA: PSA: 1.94 ng/mL (ref 0.10–4.00)

## 2017-07-07 MED ORDER — ROSUVASTATIN CALCIUM 10 MG PO TABS: 10.0000 mg | ORAL_TABLET | Freq: Every day | ORAL | 1 refills | Status: DC

## 2017-07-09 ENCOUNTER — Encounter: Payer: Self-pay | Admitting: Internal Medicine

## 2017-07-10 ENCOUNTER — Encounter (INDEPENDENT_AMBULATORY_CARE_PROVIDER_SITE_OTHER): Payer: Self-pay

## 2017-07-19 LAB — COLOGUARD: COLOGUARD: NEGATIVE

## 2017-07-31 ENCOUNTER — Encounter: Payer: Self-pay | Admitting: Internal Medicine

## 2020-06-09 ENCOUNTER — Encounter: Payer: Self-pay | Admitting: Internal Medicine

## 2020-06-09 ENCOUNTER — Other Ambulatory Visit: Payer: Self-pay

## 2020-06-09 ENCOUNTER — Ambulatory Visit (INDEPENDENT_AMBULATORY_CARE_PROVIDER_SITE_OTHER): Payer: Medicare Other | Admitting: Internal Medicine

## 2020-06-09 VITALS — BP 124/78 | HR 94 | Temp 98.5°F | Resp 16 | Ht 69.0 in | Wt 169.0 lb

## 2020-06-09 DIAGNOSIS — E785 Hyperlipidemia, unspecified: Secondary | ICD-10-CM

## 2020-06-09 DIAGNOSIS — N1831 Chronic kidney disease, stage 3a: Secondary | ICD-10-CM | POA: Diagnosis not present

## 2020-06-09 DIAGNOSIS — I10 Essential (primary) hypertension: Secondary | ICD-10-CM | POA: Diagnosis not present

## 2020-06-09 DIAGNOSIS — R634 Abnormal weight loss: Secondary | ICD-10-CM | POA: Diagnosis not present

## 2020-06-09 DIAGNOSIS — Z0000 Encounter for general adult medical examination without abnormal findings: Secondary | ICD-10-CM | POA: Diagnosis not present

## 2020-06-09 LAB — CBC WITH DIFFERENTIAL/PLATELET
Basophils Absolute: 0 10*3/uL (ref 0.0–0.1)
Basophils Relative: 0.8 % (ref 0.0–3.0)
Eosinophils Absolute: 0.6 10*3/uL (ref 0.0–0.7)
Eosinophils Relative: 10.8 % — ABNORMAL HIGH (ref 0.0–5.0)
HCT: 44.3 % (ref 39.0–52.0)
Hemoglobin: 15.4 g/dL (ref 13.0–17.0)
Lymphocytes Relative: 20.9 % (ref 12.0–46.0)
Lymphs Abs: 1.2 10*3/uL (ref 0.7–4.0)
MCHC: 34.7 g/dL (ref 30.0–36.0)
MCV: 90.5 fl (ref 78.0–100.0)
Monocytes Absolute: 0.5 10*3/uL (ref 0.1–1.0)
Monocytes Relative: 8.5 % (ref 3.0–12.0)
Neutro Abs: 3.3 10*3/uL (ref 1.4–7.7)
Neutrophils Relative %: 59 % (ref 43.0–77.0)
Platelets: 192 10*3/uL (ref 150.0–400.0)
RBC: 4.89 Mil/uL (ref 4.22–5.81)
RDW: 13.9 % (ref 11.5–15.5)
WBC: 5.5 10*3/uL (ref 4.0–10.5)

## 2020-06-09 LAB — HEPATIC FUNCTION PANEL
ALT: 18 U/L (ref 0–53)
AST: 19 U/L (ref 0–37)
Albumin: 4.3 g/dL (ref 3.5–5.2)
Alkaline Phosphatase: 69 U/L (ref 39–117)
Bilirubin, Direct: 0.1 mg/dL (ref 0.0–0.3)
Total Bilirubin: 1 mg/dL (ref 0.2–1.2)
Total Protein: 7 g/dL (ref 6.0–8.3)

## 2020-06-09 LAB — URINALYSIS, ROUTINE W REFLEX MICROSCOPIC
Bilirubin Urine: NEGATIVE
Hgb urine dipstick: NEGATIVE
Ketones, ur: NEGATIVE
Leukocytes,Ua: NEGATIVE
Nitrite: NEGATIVE
RBC / HPF: NONE SEEN (ref 0–?)
Specific Gravity, Urine: 1.025 (ref 1.000–1.030)
Total Protein, Urine: NEGATIVE
Urine Glucose: NEGATIVE
Urobilinogen, UA: 0.2 (ref 0.0–1.0)
pH: 6 (ref 5.0–8.0)

## 2020-06-09 LAB — BASIC METABOLIC PANEL
BUN: 19 mg/dL (ref 6–23)
CO2: 31 mEq/L (ref 19–32)
Calcium: 9.6 mg/dL (ref 8.4–10.5)
Chloride: 102 mEq/L (ref 96–112)
Creatinine, Ser: 1.56 mg/dL — ABNORMAL HIGH (ref 0.40–1.50)
GFR: 46.02 mL/min — ABNORMAL LOW (ref >60.00)
Glucose, Bld: 66 mg/dL — ABNORMAL LOW (ref 70–99)
Potassium: 3.8 mEq/L (ref 3.5–5.1)
Sodium: 139 mEq/L (ref 135–145)

## 2020-06-09 LAB — LIPID PANEL
Cholesterol: 231 mg/dL — ABNORMAL HIGH (ref 0–200)
HDL: 43.5 mg/dL (ref >39.00)
LDL Cholesterol: 151 mg/dL — ABNORMAL HIGH (ref 0–99)
NonHDL: 187.89
Total CHOL/HDL Ratio: 5
Triglycerides: 184 mg/dL — ABNORMAL HIGH (ref 0.0–149.0)
VLDL: 36.8 mg/dL (ref 0.0–40.0)

## 2020-06-09 LAB — PSA: PSA: 2.31 ng/mL (ref 0.10–4.00)

## 2020-06-09 LAB — TSH: TSH: 2.02 u[IU]/mL (ref 0.35–4.50)

## 2020-06-09 NOTE — Patient Instructions (Signed)

## 2020-06-09 NOTE — Progress Notes (Signed)
Subjective:  Patient ID: [name] Lambert, male    DOB: 07-20-1955  Age: 65 y.o. MRN: [id number]  CC: Annual Exam and Hypertension  This visit occurred during the SARS-CoV-2 public health emergency.  Safety protocols were in place, including screening questions prior to the visit, additional usage of staff PPE, and extensive cleaning of exam room while observing appropriate contact time as indicated for disinfecting solutions.    HPI [name] Lambert presents for a CPX.  His sister asked him to come in for a check-up. She thinks he is losing weight. He feels well and offers no complains. He has a hx of HTN and controls his BP with lifestyle modifications. He is active and denies CP/DOE/palpitations/diaphoresis.  History [name] Lambert has a past medical history of ADD (attention deficit disorder), Joint pain, Joint swelling, Multiple rib fractures, and Syncope and collapse (12/31/2012).   He has a past surgical history that includes Tonsillectomy (1950's); Appendectomy (2009); and ORIF clavicular fracture (Right, 01/08/2013).   His family history includes CAD in his father.He reports that he has never smoked. He has never used smokeless tobacco. He reports that he does not drink alcohol and does not use drugs.  Outpatient Medications Prior to Visit  Medication Sig Dispense Refill   Ciclopirox 0.77 % gel Apply 1 Act topically 2 (two) times daily. 100 g 2   rosuvastatin (CRESTOR) 10 MG tablet Take 1 tablet (10 mg total) by mouth daily. 90 tablet 1   No facility-administered medications prior to visit.    ROS Review of Systems  Constitutional: Positive for unexpected weight change (wt loss). Negative for appetite change, chills, diaphoresis and fatigue.  HENT: Negative.  Negative for sinus pressure, tinnitus and trouble swallowing.   Eyes: Negative.   Respiratory: Negative for cough, chest tightness, shortness of breath and wheezing.   Cardiovascular: Negative for chest pain, palpitations and leg  swelling.  Gastrointestinal: Negative for abdominal pain, constipation, diarrhea, nausea and vomiting.  Endocrine: Negative.  Negative for cold intolerance and heat intolerance.  Genitourinary: Negative.  Negative for difficulty urinating, dysuria, flank pain, hematuria, scrotal swelling and testicular pain.  Musculoskeletal: Negative.  Negative for arthralgias and myalgias.  Skin: Negative.   Neurological: Negative.  Negative for dizziness, weakness and light-headedness.  Hematological: Negative for adenopathy. Does not bruise/bleed easily.  Psychiatric/Behavioral: Negative.  Negative for dysphoric mood and sleep disturbance. The patient is not nervous/anxious.     Objective:  BP 124/78    Pulse 94    Temp 98.5 F (36.9 C) (Oral)    Resp 16    Ht 5\' 9"  (1.753 m)    Wt 169 lb (76.7 kg)    SpO2 99%    BMI 24.96 kg/m   Physical Exam Vitals reviewed.  Constitutional:      Appearance: Normal appearance.  HENT:     Nose: Nose normal.     Mouth/Throat:     Mouth: Mucous membranes are moist.  Eyes:     General: No scleral icterus.    Conjunctiva/sclera: Conjunctivae normal.  Cardiovascular:     Rate and Rhythm: Normal rate and regular rhythm.     Heart sounds: No murmur heard.   Pulmonary:     Effort: Pulmonary effort is normal.     Breath sounds: No stridor. No wheezing, rhonchi or rales.  Abdominal:     General: Abdomen is flat. Bowel sounds are normal. There is no distension.     Palpations: Abdomen is soft. There is no hepatomegaly, splenomegaly or  mass.     Tenderness: There is no abdominal tenderness.     Hernia: No hernia is present. There is no hernia in the left inguinal area or right inguinal area.  Genitourinary:    Pubic Area: No rash.      Penis: Normal and circumcised. No discharge, swelling or lesions.      Testes: Normal.        Right: Mass, tenderness or swelling not present.        Left: Mass, tenderness or swelling not present.     Epididymis:     Right:  Normal.     Left: Normal.     Prostate: Enlarged. Not tender and no nodules present.     Rectum: Normal. Guaiac result negative. No mass, tenderness, anal fissure, external hemorrhoid or internal hemorrhoid. Normal anal tone.  Musculoskeletal:        General: Normal range of motion.     Cervical back: Neck supple.     Right lower leg: No edema.     Left lower leg: No edema.  Lymphadenopathy:     Cervical: No cervical adenopathy.     Lower Body: No right inguinal adenopathy. No left inguinal adenopathy.  Skin:    General: Skin is warm and dry.     Coloration: Skin is not pale.  Neurological:     General: No focal deficit present.     Mental Status: He is alert and oriented to person, place, and time. Mental status is at baseline.  Psychiatric:        Mood and Affect: Mood normal.        Behavior: Behavior normal.     Lab Results  Component Value Date   WBC 5.5 06/09/2020   HGB 15.4 06/09/2020   HCT 44.3 06/09/2020   PLT 192.0 06/09/2020   GLUCOSE 66 (L) 06/09/2020   CHOL 231 (H) 06/09/2020   TRIG 184.0 (H) 06/09/2020   HDL 43.50 06/09/2020   LDLDIRECT 140.0 07/06/2017   LDLCALC 151 (H) 06/09/2020   ALT 18 06/09/2020   AST 19 06/09/2020   NA 139 06/09/2020   K 3.8 06/09/2020   CL 102 06/09/2020   CREATININE 1.56 (H) 06/09/2020   BUN 19 06/09/2020   CO2 31 06/09/2020   TSH 2.02 06/09/2020   PSA 2.31 06/09/2020    Assessment & Plan:   [name] Lambert was seen today for annual exam and hypertension.  Diagnoses and all orders for this visit:  Primary hypertension- His BP is well controlled but his GFR has declined. -     CBC with Differential/Platelet; Future -     Basic metabolic panel; Future -     TSH; Future -     TSH -     Basic metabolic panel -     CBC with Differential/Platelet  Hyperlipidemia with target LDL less than 130- He has an elevated ASCVD risk score so I have asked him to take a statin for CV risk reduction. -     TSH; Future -     Hepatic function  panel; Future -     Hepatic function panel -     TSH -     rosuvastatin (CRESTOR) 10 MG tablet; Take 1 tablet (10 mg total) by mouth daily.  Routine general medical examination at a health care facility- Exam completed, labs reviewed, he refused a flu vaccine, cancer screenings are UTD, pt ed material was given. -     Lipid panel; Future -  PSA; Future -     PSA -     Lipid panel  Unintentional weight loss- I do not see a cause for this. If weight loss continues then I will consider scanning his body. -     CBC with Differential/Platelet; Future -     Urinalysis, Routine w reflex microscopic; Future -     Hepatic function panel; Future -     Hepatic function panel -     Urinalysis, Routine w reflex microscopic -     CBC with Differential/Platelet  Stage 3a chronic kidney disease (HCC) -     Ambulatory referral to Nephrology   I have discontinued [name] Bellflower "Mike"'s Ciclopirox. I am also having him maintain his rosuvastatin.  Meds ordered this encounter  Medications   rosuvastatin (CRESTOR) 10 MG tablet    Sig: Take 1 tablet (10 mg total) by mouth daily.    Dispense:  90 tablet    Refill:  1     Follow-up: Return in about 4 weeks (around 07/07/2020).  Sanda Linger, MD

## 2020-06-10 DIAGNOSIS — N1831 Chronic kidney disease, stage 3a: Secondary | ICD-10-CM | POA: Insufficient documentation

## 2020-06-10 MED ORDER — ROSUVASTATIN CALCIUM 10 MG PO TABS: 10.0000 mg | ORAL_TABLET | Freq: Every day | ORAL | 1 refills | Status: DC

## 2020-06-12 ENCOUNTER — Telehealth: Payer: Self-pay | Admitting: Internal Medicine

## 2020-06-12 NOTE — Telephone Encounter (Signed)
   Patient would like more information why he was referred to [facility]  Please call

## 2020-06-12 NOTE — Telephone Encounter (Signed)
Pt has been informed of results and expressed understanding. He stated that he has not seen his results on MyChart but I did send a hard copy out to the pt for his records.

## 2020-06-29 ENCOUNTER — Other Ambulatory Visit: Payer: Self-pay | Admitting: Internal Medicine

## 2020-06-29 DIAGNOSIS — N1831 Chronic kidney disease, stage 3a: Secondary | ICD-10-CM

## 2020-07-13 ENCOUNTER — Other Ambulatory Visit: Payer: Medicare Other

## 2020-07-27 ENCOUNTER — Other Ambulatory Visit: Payer: Medicare Other

## 2020-08-04 ENCOUNTER — Other Ambulatory Visit: Payer: Medicare Other

## 2020-08-10 ENCOUNTER — Ambulatory Visit
Admission: RE | Admit: 2020-08-10 | Discharge: 2020-08-10 | Disposition: A | Discharge status: Home / Self Care | Payer: Medicare Other | Source: Ambulatory Visit | Attending: Internal Medicine | Admitting: Internal Medicine

## 2020-08-10 DIAGNOSIS — N1831 Chronic kidney disease, stage 3a: Secondary | ICD-10-CM

## 2020-08-19 ENCOUNTER — Telehealth: Payer: Self-pay

## 2020-08-19 NOTE — Telephone Encounter (Signed)
Left voicemail for patient to call the office back to schedule an appointment to see our health coach

## 2020-12-07 ENCOUNTER — Other Ambulatory Visit: Payer: Self-pay | Admitting: Internal Medicine

## 2020-12-07 DIAGNOSIS — E785 Hyperlipidemia, unspecified: Secondary | ICD-10-CM

## 2020-12-15 ENCOUNTER — Other Ambulatory Visit: Payer: Self-pay

## 2020-12-15 ENCOUNTER — Ambulatory Visit (INDEPENDENT_AMBULATORY_CARE_PROVIDER_SITE_OTHER): Payer: Medicare Other | Admitting: Internal Medicine

## 2020-12-15 ENCOUNTER — Encounter: Payer: Self-pay | Admitting: Internal Medicine

## 2020-12-15 VITALS — BP 128/82 | HR 87 | Temp 98.4°F | Resp 16 | Ht 69.0 in | Wt 170.0 lb

## 2020-12-15 DIAGNOSIS — I10 Essential (primary) hypertension: Secondary | ICD-10-CM | POA: Diagnosis not present

## 2020-12-15 DIAGNOSIS — R200 Anesthesia of skin: Secondary | ICD-10-CM | POA: Diagnosis not present

## 2020-12-15 DIAGNOSIS — E785 Hyperlipidemia, unspecified: Secondary | ICD-10-CM | POA: Diagnosis not present

## 2020-12-15 DIAGNOSIS — Z23 Encounter for immunization: Secondary | ICD-10-CM

## 2020-12-15 DIAGNOSIS — R202 Paresthesia of skin: Secondary | ICD-10-CM | POA: Diagnosis not present

## 2020-12-15 DIAGNOSIS — Z1211 Encounter for screening for malignant neoplasm of colon: Secondary | ICD-10-CM

## 2020-12-15 LAB — CBC WITH DIFFERENTIAL/PLATELET
Basophils Absolute: 0 10*3/uL (ref 0.0–0.1)
Basophils Relative: 0.9 % (ref 0.0–3.0)
Eosinophils Absolute: 0.5 10*3/uL (ref 0.0–0.7)
Eosinophils Relative: 10.9 % — ABNORMAL HIGH (ref 0.0–5.0)
HCT: 46.9 % (ref 39.0–52.0)
Hemoglobin: 16.1 g/dL (ref 13.0–17.0)
Lymphocytes Relative: 22.8 % (ref 12.0–46.0)
Lymphs Abs: 1 10*3/uL (ref 0.7–4.0)
MCHC: 34.3 g/dL (ref 30.0–36.0)
MCV: 90.6 fl (ref 78.0–100.0)
Monocytes Absolute: 0.5 10*3/uL (ref 0.1–1.0)
Monocytes Relative: 11.6 % (ref 3.0–12.0)
Neutro Abs: 2.3 10*3/uL (ref 1.4–7.7)
Neutrophils Relative %: 53.8 % (ref 43.0–77.0)
Platelets: 188 10*3/uL (ref 150.0–400.0)
RBC: 5.18 Mil/uL (ref 4.22–5.81)
RDW: 13.2 % (ref 11.5–15.5)
WBC: 4.3 10*3/uL (ref 4.0–10.5)

## 2020-12-15 LAB — BASIC METABOLIC PANEL
BUN: 16 mg/dL (ref 6–23)
CO2: 30 mEq/L (ref 19–32)
Calcium: 9.9 mg/dL (ref 8.4–10.5)
Chloride: 101 mEq/L (ref 96–112)
Creatinine, Ser: 1.48 mg/dL (ref 0.40–1.50)
GFR: 48.84 mL/min — ABNORMAL LOW (ref >60.00)
Glucose, Bld: 86 mg/dL (ref 70–99)
Potassium: 4 mEq/L (ref 3.5–5.1)
Sodium: 138 mEq/L (ref 135–145)

## 2020-12-15 LAB — FOLATE: Folate: 9.6 ng/mL (ref >5.9)

## 2020-12-15 LAB — VITAMIN B12: Vitamin B-12: 394 pg/mL (ref 211–911)

## 2020-12-15 MED ORDER — ROSUVASTATIN CALCIUM 10 MG PO TABS: 10.0000 mg | ORAL_TABLET | Freq: Every day | ORAL | 1 refills | Status: DC

## 2020-12-15 NOTE — Progress Notes (Signed)
Subjective:  Patient ID: [name] Lambert, male    DOB: [birth year]  Age: 65 y.o. MRN: [id number]  CC: Hypertension and Hyperlipidemia  This visit occurred during the SARS-CoV-2 public health emergency.  Safety protocols were in place, including screening questions prior to the visit, additional usage of staff PPE, and extensive cleaning of exam room while observing appropriate contact time as indicated for disinfecting solutions.    HPI [name] Lambert presents for f/up -   He tells me that he will soon be seeing orthopedics about low back pain and lower extremity numbness and tingling.  He is tolerating Crestor well with no muscle or joint aches.  He tells me his blood pressure has been well controlled.  Outpatient Medications Prior to Visit  Medication Sig Dispense Refill   rosuvastatin (CRESTOR) 10 MG tablet Take 1 tablet (10 mg total) by mouth daily. 90 tablet 1   No facility-administered medications prior to visit.    ROS Review of Systems  Constitutional:  Negative for chills, diaphoresis, fatigue and fever.  HENT: Negative.    Eyes: Negative.   Respiratory:  Negative for cough, chest tightness, shortness of breath and wheezing.   Cardiovascular:  Negative for chest pain, palpitations and leg swelling.  Gastrointestinal:  Negative for abdominal pain, diarrhea, nausea and vomiting.  Endocrine: Negative.   Genitourinary: Negative.  Negative for difficulty urinating and dysuria.  Musculoskeletal:  Positive for back pain. Negative for arthralgias, myalgias and neck pain.  Skin: Negative.  Negative for color change and pallor.  Neurological:  Positive for numbness. Negative for dizziness, weakness and light-headedness.  Hematological:  Negative for adenopathy. Does not bruise/bleed easily.  Psychiatric/Behavioral: Negative.     Objective:  BP 128/82 (BP Location: Right Arm, Patient Position: Sitting, Cuff Size: Large)   Pulse 87   Temp 98.4 F (36.9 C) (Oral)   Resp 16   Ht  5\' 9"  (1.753 m)   Wt 170 lb (77.1 kg)   SpO2 99%   BMI 25.10 kg/m   BP Readings from Last 3 Encounters:  12/15/20 128/82  06/09/20 124/78  07/06/17 (!) 142/92    Wt Readings from Last 3 Encounters:  12/15/20 170 lb (77.1 kg)  06/09/20 169 lb (76.7 kg)  07/06/17 184 lb 1.9 oz (83.5 kg)    Physical Exam Vitals reviewed.  HENT:     Nose: Nose normal.     Mouth/Throat:     Mouth: Mucous membranes are moist.  Eyes:     General: No scleral icterus.    Conjunctiva/sclera: Conjunctivae normal.  Cardiovascular:     Rate and Rhythm: Normal rate and regular rhythm.     Heart sounds: No murmur heard. Pulmonary:     Effort: Pulmonary effort is normal.     Breath sounds: No stridor. No wheezing, rhonchi or rales.  Abdominal:     General: Abdomen is flat.     Palpations: There is no mass.     Tenderness: There is no abdominal tenderness. There is no guarding.     Hernia: No hernia is present.  Musculoskeletal:        General: Normal range of motion.     Cervical back: Neck supple.     Right lower leg: No edema.     Left lower leg: No edema.  Lymphadenopathy:     Cervical: No cervical adenopathy.  Skin:    General: Skin is warm and dry.  Neurological:     Mental Status: He is alert  and oriented to person, place, and time. Mental status is at baseline.  Psychiatric:        Mood and Affect: Mood normal.        Behavior: Behavior normal.    Lab Results  Component Value Date   WBC 4.3 12/15/2020   HGB 16.1 12/15/2020   HCT 46.9 12/15/2020   PLT 188.0 12/15/2020   GLUCOSE 86 12/15/2020   CHOL 231 (H) 06/09/2020   TRIG 184.0 (H) 06/09/2020   HDL 43.50 06/09/2020   LDLDIRECT 140.0 07/06/2017   LDLCALC 151 (H) 06/09/2020   ALT 18 06/09/2020   AST 19 06/09/2020   NA 138 12/15/2020   K 4.0 12/15/2020   CL 101 12/15/2020   CREATININE 1.48 12/15/2020   BUN 16 12/15/2020   CO2 30 12/15/2020   TSH 2.02 06/09/2020   PSA 2.31 06/09/2020    US RENAL  Result Date:  08/11/2020 CLINICAL DATA:  Stage III CKD EXAM: RENAL / URINARY TRACT ULTRASOUND COMPLETE COMPARISON:  None. FINDINGS: Right Kidney: Renal measurements: 10.2 x 6.0 x 4.8 cm = volume: 152 mL. Echogenicity within normal limits. No mass or hydronephrosis visualized. Left Kidney: Renal measurements: 10.6 x 4.2 x 4.5 cm = volume: 105 mL. Echogenicity within normal limits. No mass or hydronephrosis visualized. Bladder: Appears normal for degree of bladder distention. Other: Prostate size at the upper limits of normal. IMPRESSION: Unremarkable sonographic appearance of the bilateral kidneys and bladder. Electronically Signed   By: Emmaline Kluver M.D.   On: 08/11/2020 09:16    Assessment & Plan:   Manual was seen today for hypertension and hyperlipidemia.  Diagnoses and all orders for this visit:  Numbness and tingling of both lower extremities- I will screen for vitamin deficiencies. -     CBC with Differential/Platelet; Future -     Vitamin B12; Future -     Folate; Future -     Vitamin B1; Future -     Vitamin B1 -     Folate -     Vitamin B12 -     CBC with Differential/Platelet  Hyperlipidemia with target LDL less than 130- LDL goal achieved. Doing well on the statin  -     rosuvastatin (CRESTOR) 10 MG tablet; Take 1 tablet (10 mg total) by mouth daily.  Flu vaccine need -     Flu Vaccine QUAD High Dose(Fluad)  Need for vaccination -     Pneumococcal conjugate vaccine 20-valent  Primary hypertension- His BP is well controlled with lifestyle modifications. -     CBC with Differential/Platelet; Future -     Basic metabolic panel; Future -     Basic metabolic panel -     CBC with Differential/Platelet  Colon cancer screening -     Cologuard  I am having [name] Lambert "[name] Lambert" maintain his rosuvastatin.  Meds ordered this encounter  Medications   rosuvastatin (CRESTOR) 10 MG tablet    Sig: Take 1 tablet (10 mg total) by mouth daily.    Dispense:  90 tablet    Refill:  1      Follow-up: Return in about 6 months (around 06/14/2021).  Sanda Linger, MD

## 2020-12-15 NOTE — Patient Instructions (Signed)

## 2020-12-19 LAB — VITAMIN B1: Vitamin B1 (Thiamine): 12 nmol/L (ref 8–30)

## 2020-12-31 LAB — COLOGUARD

## 2021-01-11 ENCOUNTER — Ambulatory Visit: Payer: Medicare Other | Admitting: Internal Medicine

## 2021-03-08 ENCOUNTER — Telehealth: Payer: Self-pay | Admitting: Internal Medicine

## 2021-03-08 NOTE — Telephone Encounter (Signed)
Patient calling in  Says he thinks he has been experiencing symptoms of vertigo  Offered to schedule first available OV patient declined  Wants recommendations from provider about what he can do for this [phone]
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
## 2021-03-15 ENCOUNTER — Ambulatory Visit (INDEPENDENT_AMBULATORY_CARE_PROVIDER_SITE_OTHER): Payer: Medicare Other | Admitting: Internal Medicine

## 2021-03-15 ENCOUNTER — Other Ambulatory Visit: Payer: Self-pay

## 2021-03-15 ENCOUNTER — Encounter: Payer: Self-pay | Admitting: Internal Medicine

## 2021-03-15 VITALS — BP 126/72 | HR 88 | Temp 98.5°F | Ht 69.0 in | Wt 172.0 lb

## 2021-03-15 DIAGNOSIS — H8110 Benign paroxysmal vertigo, unspecified ear: Secondary | ICD-10-CM

## 2021-03-15 DIAGNOSIS — H6123 Impacted cerumen, bilateral: Secondary | ICD-10-CM

## 2021-03-15 NOTE — Progress Notes (Signed)
Patient consent obtained. Irrigation with water and peroxide performed. Full view of tympanic membranes after procedure.  Patient tolerated procedure well.   

## 2021-03-15 NOTE — Patient Instructions (Addendum)
Your ears were cleaned out today.   Your symptoms are consistent with vertigo.     If your symptoms recur you can take non-drowsy dramamine or we can prescribe meclizine.    Benign Positional Vertigo Vertigo is the feeling that you or your surroundings are moving when they are not. Benign positional vertigo is the most common form of vertigo. This is usually a harmless condition (benign). This condition is positional. This means that symptoms are triggered by certain movements and positions. This condition can be dangerous if it occurs while you are doing something that could cause harm to yourself or others. This includes activities such as driving or operating machinery. What are the causes? The inner ear has fluid-filled canals that help your brain sense movement and balance. When the fluid moves, the brain receives messages about your body's position. With benign positional vertigo, calcium crystals in the inner ear break free and disturb the inner ear area. This causes your brain to receive confusing messages about your body's position. What increases the risk? You are more likely to develop this condition if: You are a woman. You are 65 years of age or older. You have recently had a head injury. You have an inner ear disease. What are the signs or symptoms? Symptoms of this condition usually happen when you move your head or your eyes in different directions. Symptoms may start suddenly and usually last for less than a minute. They include: Loss of balance and falling. Feeling like you are spinning or moving. Feeling like your surroundings are spinning or moving. Nausea and vomiting. Blurred vision. Dizziness. Involuntary eye movement (nystagmus). Symptoms can be mild and cause only minor problems, or they can be severe and interfere with daily life. Episodes of benign positional vertigo may return (recur) over time. Symptoms may also improve over time. How is this  diagnosed? This condition may be diagnosed based on: Your medical history. A physical exam of the head, neck, and ears. Positional tests to check for or stimulate vertigo. You may be asked to turn your head and change positions, such as going from sitting to lying down. A health care provider will watch for symptoms of vertigo. You may be referred to a health care provider who specializes in ear, nose, and throat problems (ENT or otolaryngologist) or a provider who specializes in disorders of the nervous system (neurologist). How is this treated? This condition may be treated in a session in which your health care provider moves your head in specific positions to help the displaced crystals in your inner ear move. Treatment for this condition may take several sessions. Surgery may be needed in severe cases, but this is rare. In some cases, benign positional vertigo may resolve on its own in 2-4 weeks. Follow these instructions at home: Safety Move slowly. Avoid sudden body or head movements or certain positions, as told by your health care provider. Avoid driving or operating machinery until your health care provider says it is safe. Avoid doing any tasks that would be dangerous to you or others if vertigo occurs. If you have trouble walking or keeping your balance, try using a cane for stability. If you feel dizzy or unstable, sit down right away. Return to your normal activities as told by your health care provider. Ask your health care provider what activities are safe for you. General instructions Take over-the-counter and prescription medicines only as told by your health care provider. Drink enough fluid to keep your urine pale yellow.  Keep all follow-up visits. This is important. Contact a health care provider if: You have a fever. Your condition gets worse or you develop new symptoms. Your family or friends notice any behavioral changes. You have nausea or vomiting that gets  worse. You have numbness or a prickling and tingling sensation. Get help right away if you: Have difficulty speaking or moving. Are always dizzy or faint. Develop severe headaches. Have weakness in your legs or arms. Have changes in your hearing or vision. Develop a stiff neck. Develop sensitivity to light. These symptoms may represent a serious problem that is an emergency. Do not wait to see if the symptoms will go away. Get medical help right away. Call your local emergency services (911 in the [place] Do not drive yourself to the hospital. Summary Vertigo is the feeling that you or your surroundings are moving when they are not. Benign positional vertigo is the most common form of vertigo. This condition is caused by calcium crystals in the inner ear that become displaced. This causes a disturbance in an area of the inner ear that helps your brain sense movement and balance. Symptoms include loss of balance and falling, feeling that you or your surroundings are moving, nausea and vomiting, and blurred vision. This condition can be diagnosed based on symptoms, a physical exam, and positional tests. Follow safety instructions as told by your health care provider and keep all follow-up visits. This is important. This information is not intended to replace advice given to you by your health care provider. Make sure you discuss any questions you have with your health care provider. Document Revised: 02/19/2020 Document Reviewed: 02/19/2020 Elsevier Patient Education  2022 [employer].

## 2021-06-14 ENCOUNTER — Other Ambulatory Visit: Payer: Self-pay | Admitting: Internal Medicine

## 2021-06-14 DIAGNOSIS — E785 Hyperlipidemia, unspecified: Secondary | ICD-10-CM

## 2021-07-01 ENCOUNTER — Ambulatory Visit (INDEPENDENT_AMBULATORY_CARE_PROVIDER_SITE_OTHER): Payer: Medicare Other | Admitting: Internal Medicine

## 2021-07-01 ENCOUNTER — Encounter: Payer: Self-pay | Admitting: Internal Medicine

## 2021-07-01 VITALS — BP 136/84 | HR 76 | Temp 98.2°F | Ht 69.0 in | Wt 169.0 lb

## 2021-07-01 DIAGNOSIS — N1831 Chronic kidney disease, stage 3a: Secondary | ICD-10-CM

## 2021-07-01 DIAGNOSIS — Z1211 Encounter for screening for malignant neoplasm of colon: Secondary | ICD-10-CM

## 2021-07-01 DIAGNOSIS — I10 Essential (primary) hypertension: Secondary | ICD-10-CM

## 2021-07-01 DIAGNOSIS — E785 Hyperlipidemia, unspecified: Secondary | ICD-10-CM | POA: Diagnosis not present

## 2021-07-01 DIAGNOSIS — Z23 Encounter for immunization: Secondary | ICD-10-CM

## 2021-07-01 LAB — URINALYSIS, ROUTINE W REFLEX MICROSCOPIC
Bilirubin Urine: NEGATIVE
Ketones, ur: NEGATIVE
Leukocytes,Ua: NEGATIVE
Nitrite: NEGATIVE
Specific Gravity, Urine: 1.025 (ref 1.000–1.030)
Total Protein, Urine: NEGATIVE
Urine Glucose: NEGATIVE
Urobilinogen, UA: 0.2 (ref 0.0–1.0)
WBC, UA: NONE SEEN (ref 0–?)
pH: 6 (ref 5.0–8.0)

## 2021-07-01 LAB — LIPID PANEL
Cholesterol: 142 mg/dL (ref 0–200)
HDL: 48.1 mg/dL (ref >39.00)
LDL Cholesterol: 74 mg/dL (ref 0–99)
NonHDL: 93.81
Total CHOL/HDL Ratio: 3
Triglycerides: 100 mg/dL (ref 0.0–149.0)
VLDL: 20 mg/dL (ref 0.0–40.0)

## 2021-07-01 LAB — CBC WITH DIFFERENTIAL/PLATELET
Basophils Absolute: 0 10*3/uL (ref 0.0–0.1)
Basophils Relative: 0.5 % (ref 0.0–3.0)
Eosinophils Absolute: 0.2 10*3/uL (ref 0.0–0.7)
Eosinophils Relative: 3.4 % (ref 0.0–5.0)
HCT: 45.6 % (ref 39.0–52.0)
Hemoglobin: 15.5 g/dL (ref 13.0–17.0)
Lymphocytes Relative: 17.8 % (ref 12.0–46.0)
Lymphs Abs: 1.1 10*3/uL (ref 0.7–4.0)
MCHC: 34.1 g/dL (ref 30.0–36.0)
MCV: 92.7 fl (ref 78.0–100.0)
Monocytes Absolute: 0.7 10*3/uL (ref 0.1–1.0)
Monocytes Relative: 10.8 % (ref 3.0–12.0)
Neutro Abs: 4.3 10*3/uL (ref 1.4–7.7)
Neutrophils Relative %: 67.5 % (ref 43.0–77.0)
Platelets: 203 10*3/uL (ref 150.0–400.0)
RBC: 4.92 Mil/uL (ref 4.22–5.81)
RDW: 12.9 % (ref 11.5–15.5)
WBC: 6.3 10*3/uL (ref 4.0–10.5)

## 2021-07-01 LAB — HEPATIC FUNCTION PANEL
ALT: 28 U/L (ref 0–53)
AST: 23 U/L (ref 0–37)
Albumin: 4.6 g/dL (ref 3.5–5.2)
Alkaline Phosphatase: 61 U/L (ref 39–117)
Bilirubin, Direct: 0.1 mg/dL (ref 0.0–0.3)
Total Bilirubin: 1 mg/dL (ref 0.2–1.2)
Total Protein: 6.9 g/dL (ref 6.0–8.3)

## 2021-07-01 LAB — BASIC METABOLIC PANEL
BUN: 21 mg/dL (ref 6–23)
CO2: 28 mEq/L (ref 19–32)
Calcium: 9.6 mg/dL (ref 8.4–10.5)
Chloride: 103 mEq/L (ref 96–112)
Creatinine, Ser: 1.38 mg/dL (ref 0.40–1.50)
GFR: 52.92 mL/min — ABNORMAL LOW (ref >60.00)
Glucose, Bld: 80 mg/dL (ref 70–99)
Potassium: 4.2 mEq/L (ref 3.5–5.1)
Sodium: 139 mEq/L (ref 135–145)

## 2021-07-01 LAB — TSH: TSH: 1.55 u[IU]/mL (ref 0.35–5.50)

## 2021-07-01 MED ORDER — ROSUVASTATIN CALCIUM 10 MG PO TABS: 10.0000 mg | ORAL_TABLET | Freq: Every day | ORAL | 1 refills | Status: DC

## 2021-07-01 MED ORDER — OLMESARTAN MEDOXOMIL 20 MG PO TABS: 20.0000 mg | ORAL_TABLET | Freq: Every day | ORAL | 1 refills | Status: DC

## 2021-07-01 MED ORDER — SHINGRIX 50 MCG/0.5ML IM SUSR: 0.5000 mL | Freq: Once | INTRAMUSCULAR | 1 refills | Status: AC

## 2021-07-01 NOTE — Progress Notes (Signed)
? ?Subjective:  ?Patient ID: [name] Lambert, male    DOB: April 14, 1955  Age: 66 y.o. MRN: [id number] ? ?CC: Hypertension and Hyperlipidemia ? ? ?HPI ?[name] Lambert presents for f/up -  ? ?He uses a push mower and does not experience chest pain, shortness of breath, diaphoresis, edema, or fatigue. ? ?Outpatient Medications Prior to Visit  ?Medication Sig Dispense Refill  ? rosuvastatin (CRESTOR) 10 MG tablet Take 1 tablet (10 mg total) by mouth daily. 90 tablet 1  ? ?No facility-administered medications prior to visit.  ? ? ?ROS ?Review of Systems  ?Constitutional:  Negative for diaphoresis, fatigue and unexpected weight change.  ?HENT: Negative.    ?Eyes: Negative.   ?Respiratory:  Negative for cough and shortness of breath.   ?Cardiovascular:  Negative for chest pain, palpitations and leg swelling.  ?Gastrointestinal:  Negative for abdominal pain, diarrhea, nausea and vomiting.  ?Endocrine: Negative.   ?Genitourinary: Negative.  Negative for difficulty urinating.  ?Musculoskeletal:  Positive for arthralgias. Negative for myalgias.  ?Skin: Negative.   ?Neurological: Negative.  Negative for dizziness, weakness and light-headedness.  ?Hematological:  Negative for adenopathy. Does not bruise/bleed easily.  ?Psychiatric/Behavioral: Negative.    ? ?Objective:  ?BP 136/84 (BP Location: Right Arm, Patient Position: Sitting, Cuff Size: Large)   Pulse 76   Temp 98.2 ?F (36.8 ?C) (Oral)   Ht 5\' 9"  (1.753 m)   Wt 169 lb (76.7 kg)   SpO2 97%   BMI 24.96 kg/m?  ? ?BP Readings from Last 3 Encounters:  ?07/01/21 136/84  ?03/15/21 126/72  ?12/15/20 128/82  ? ? ?Wt Readings from Last 3 Encounters:  ?07/01/21 169 lb (76.7 kg)  ?03/15/21 172 lb (78 kg)  ?12/15/20 170 lb (77.1 kg)  ? ? ?Physical Exam ?Vitals reviewed.  ?Constitutional:   ?   Appearance: Normal appearance.  ?HENT:  ?   Nose: Nose normal.  ?   Mouth/Throat:  ?   Mouth: Mucous membranes are moist.  ?Eyes:  ?   General: No scleral icterus. ?   Conjunctiva/sclera:  Conjunctivae normal.  ?Cardiovascular:  ?   Rate and Rhythm: Normal rate and regular rhythm.  ?   Heart sounds: No murmur heard. ?Pulmonary:  ?   Effort: Pulmonary effort is normal.  ?   Breath sounds: No stridor. No wheezing, rhonchi or rales.  ?Abdominal:  ?   General: Abdomen is flat.  ?   Palpations: There is no mass.  ?   Tenderness: There is no abdominal tenderness. There is no guarding.  ?   Hernia: No hernia is present.  ?Musculoskeletal:     ?   General: Normal range of motion.  ?   Cervical back: Neck supple.  ?   Right lower leg: No edema.  ?   Left lower leg: No edema.  ?Lymphadenopathy:  ?   Cervical: No cervical adenopathy.  ?Skin: ?   General: Skin is warm.  ?Neurological:  ?   General: No focal deficit present.  ?   Mental Status: He is alert.  ?Psychiatric:     ?   Mood and Affect: Mood normal.     ?   Behavior: Behavior normal.  ? ? ?Lab Results  ?Component Value Date  ? WBC 6.3 07/01/2021  ? HGB 15.5 07/01/2021  ? HCT 45.6 07/01/2021  ? PLT 203.0 07/01/2021  ? GLUCOSE 80 07/01/2021  ? CHOL 142 07/01/2021  ? TRIG 100.0 07/01/2021  ? HDL 48.10 07/01/2021  ? LDLDIRECT 140.0 07/06/2017  ?  LDLCALC 74 07/01/2021  ? ALT 28 07/01/2021  ? AST 23 07/01/2021  ? NA 139 07/01/2021  ? K 4.2 07/01/2021  ? CL 103 07/01/2021  ? CREATININE 1.38 07/01/2021  ? BUN 21 07/01/2021  ? CO2 28 07/01/2021  ? TSH 1.55 07/01/2021  ? PSA 2.31 06/09/2020  ? ? ?US RENAL ? ?Result Date: 08/11/2020 ?CLINICAL DATA:  Stage III CKD EXAM: RENAL / URINARY TRACT ULTRASOUND COMPLETE COMPARISON:  None. FINDINGS: Right Kidney: Renal measurements: 10.2 x 6.0 x 4.8 cm = volume: 152 mL. Echogenicity within normal limits. No mass or hydronephrosis visualized. Left Kidney: Renal measurements: 10.6 x 4.2 x 4.5 cm = volume: 105 mL. Echogenicity within normal limits. No mass or hydronephrosis visualized. Bladder: Appears normal for degree of bladder distention. Other: Prostate size at the upper limits of normal. IMPRESSION: Unremarkable sonographic  appearance of the bilateral kidneys and bladder. Electronically Signed   By: Emmaline Kluver M.D.   On: 08/11/2020 09:16  ? ? ?Assessment & Plan:  ? ?Indie was seen today for hypertension and hyperlipidemia. ? ?Diagnoses and all orders for this visit: ? ?Primary hypertension- His blood pressure is not adequately well controlled.  I recommended that he start taking an ARB. ?-     Basic metabolic panel; Future ?-     TSH; Future ?-     Urinalysis, Routine w reflex microscopic; Future ?-     Hepatic function panel; Future ?-     CBC with Differential/Platelet; Future ?-     CBC with Differential/Platelet ?-     Hepatic function panel ?-     Urinalysis, Routine w reflex microscopic ?-     TSH ?-     Basic metabolic panel ? ?Hyperlipidemia with target LDL less than 130-  LDL goal achieved. Doing well on the statin  ?-     rosuvastatin (CRESTOR) 10 MG tablet; Take 1 tablet (10 mg total) by mouth daily. ?-     Lipid panel; Future ?-     TSH; Future ?-     Hepatic function panel; Future ?-     Hepatic function panel ?-     TSH ?-     Lipid panel ? ?Stage 3a chronic kidney disease (HCC)- His renal function is stable.  Will try to get better control of his blood pressure. ?-     Basic metabolic panel; Future ?-     Urinalysis, Routine w reflex microscopic; Future ?-     CBC with Differential/Platelet; Future ?-     CBC with Differential/Platelet ?-     Urinalysis, Routine w reflex microscopic ?-     Basic metabolic panel ? ?Colon cancer screening ?-     Cologuard ? ?Need for shingles vaccine ?-     Zoster Vaccine Adjuvanted [facility]) injection; Inject 0.5 mLs into the muscle once for 1 dose. ? ? ?I am having [name] [place] "[name]" start on Shingrix. I am also having him maintain his rosuvastatin. ? ?Meds ordered this encounter  ?Medications  ? rosuvastatin (CRESTOR) 10 MG tablet  ?  Sig: Take 1 tablet (10 mg total) by mouth daily.  ?  Dispense:  90 tablet  ?  Refill:  1  ? Zoster Vaccine Adjuvanted [facility]) injection  ?   Sig: Inject 0.5 mLs into the muscle once for 1 dose.  ?  Dispense:  0.5 mL  ?  Refill:  1  ? ? ? ?Follow-up: Return in about 6 months (around 01/01/2022). ? ?Maisie Fus  Yetta Barre, MD ?

## 2021-07-01 NOTE — Patient Instructions (Signed)

## 2021-07-05 ENCOUNTER — Telehealth: Payer: Self-pay | Admitting: Internal Medicine

## 2021-07-05 NOTE — Telephone Encounter (Signed)
Pt checking status of 07-01-2021 lab results ? ?Informed pt of providers 07-02-2021 result note and recommendations ? ?Pt verbalized understanding and requesting a copy of lab results email to email address on file ?

## 2021-07-05 NOTE — Telephone Encounter (Signed)
Noted ? ?Copy of results have been printed and mailed.  ?

## 2021-07-06 ENCOUNTER — Telehealth: Payer: Self-pay

## 2021-07-06 NOTE — Telephone Encounter (Signed)
Key: [id number] ? ? ?

## 2021-07-07 ENCOUNTER — Other Ambulatory Visit: Payer: Self-pay | Admitting: Internal Medicine

## 2021-07-07 DIAGNOSIS — I10 Essential (primary) hypertension: Secondary | ICD-10-CM

## 2021-07-07 MED ORDER — CANDESARTAN CILEXETIL 16 MG PO TABS: 16.0000 mg | ORAL_TABLET | Freq: Every day | ORAL | 1 refills | Status: DC

## 2021-07-07 NOTE — Telephone Encounter (Signed)
Per CoverMyMeds [place] was denied.  ? ?Reason given: ? ?Olmesartan is denied because it is not on your plan's Drug List (formulary). Medicationauthorization requires the following: ?(1) You need to try four (4) of these covered drugs: ? ?(a) Candesartan. ?(b) Irbesartan. ?(c) Telmisartan. ?(d) Valsartan tablet ?

## 2021-07-21 ENCOUNTER — Encounter: Payer: Self-pay | Admitting: Family Medicine

## 2021-07-21 ENCOUNTER — Ambulatory Visit (INDEPENDENT_AMBULATORY_CARE_PROVIDER_SITE_OTHER): Payer: Medicare Other | Admitting: Family Medicine

## 2021-07-21 VITALS — BP 128/72 | HR 100 | Temp 98.7°F | Ht 69.0 in | Wt 166.6 lb

## 2021-07-21 DIAGNOSIS — H6121 Impacted cerumen, right ear: Secondary | ICD-10-CM | POA: Insufficient documentation

## 2021-07-21 DIAGNOSIS — I10 Essential (primary) hypertension: Secondary | ICD-10-CM | POA: Diagnosis not present

## 2021-07-21 DIAGNOSIS — R42 Dizziness and giddiness: Secondary | ICD-10-CM | POA: Diagnosis not present

## 2021-07-21 MED ORDER — MECLIZINE HCL 25 MG PO TABS: 25.0000 mg | ORAL_TABLET | Freq: Two times a day (BID) | ORAL | 0 refills | Status: DC | PRN

## 2021-07-21 NOTE — Assessment & Plan Note (Signed)
Ear lavage performed by CMA.  He tolerated this well and noted immediate improvement post lavage.  TM normal-appearing. ?
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
## 2021-07-21 NOTE — Patient Instructions (Signed)
Make sure you are staying well-hydrated and avoid skipping meals.  Try to get protein in throughout the day. ? ?If the dizzy spells return, you may try the meclizine which I prescribed. ? ?Change positions slowly. ? ?If you develop any strokelike symptoms such as facial drooping, arm or leg weakness, slurred speech, difficulty thinking, chest pain or any other worrisome symptoms then you should call 911 or be seen right away.  I do not expect this to happen ? ?Follow-up as needed. ? ? ?Dizziness ?Dizziness is a common problem. It is a feeling of unsteadiness or light-headedness. You may feel like you are about to faint. Dizziness can lead to injury if you stumble or fall. Anyone can become dizzy, but dizziness is more common in older adults. This condition can be caused by a number of things, including medicines, dehydration, or illness. ?Follow these instructions at home: ?Eating and drinking ? ?Drink enough fluid to keep your urine pale yellow. This helps to keep you from becoming dehydrated. Try to drink more clear fluids, such as water. ?Do not drink alcohol. ?Limit your caffeine intake if told to do so by your health care provider. Check ingredients and nutrition facts to see if a food or beverage contains caffeine. ?Limit your salt (sodium) intake if told to do so by your health care provider. Check ingredients and nutrition facts to see if a food or beverage contains sodium. ?Activity ? ?Avoid making quick movements. ?Rise slowly from chairs and steady yourself until you feel okay. ?In the morning, first sit up on the side of the bed. When you feel okay, stand slowly while you hold onto something until you know that your balance is good. ?If you need to stand in one place for a long time, move your legs often. Tighten and relax the muscles in your legs while you are standing. ?Do not drive or use machinery if you feel dizzy. ?Avoid bending down if you feel dizzy. Place items in your home so that they are easy  for you to reach without leaning over. ?Lifestyle ?Do not use any products that contain nicotine or tobacco. These products include cigarettes, chewing tobacco, and vaping devices, such as e-cigarettes. If you need help quitting, ask your health care provider. ?Try to reduce your stress level by using methods such as yoga or meditation. Talk with your health care provider if you need help to manage your stress. ?General instructions ?Watch your dizziness for any changes. ?Take over-the-counter and prescription medicines only as told by your health care provider. Talk with your health care provider if you think that your dizziness is caused by a medicine that you are taking. ?Tell a friend or a family member that you are feeling dizzy. If he or she notices any changes in your behavior, have this person call your health care provider. ?Keep all follow-up visits. This is important. ?Contact a health care provider if: ?Your dizziness does not go away or you have new symptoms. ?Your dizziness or light-headedness gets worse. ?You feel nauseous. ?You have reduced hearing. ?You have a fever. ?You have neck pain or a stiff neck. ?Your dizziness leads to an injury or a fall. ?Get help right away if: ?You vomit or have diarrhea and are unable to eat or drink anything. ?You have problems talking, walking, swallowing, or using your arms, hands, or legs. ?You feel generally weak. ?You have any bleeding. ?You are not thinking clearly or you have trouble forming sentences. It may take a friend  or family member to notice this. ?You have chest pain, abdominal pain, shortness of breath, or sweating. ?Your vision changes or you develop a severe headache. ?These symptoms may represent a serious problem that is an emergency. Do not wait to see if the symptoms will go away. Get medical help right away. Call your local emergency services (911 in the [place] Do not drive yourself to the hospital. ?Summary ?Dizziness is a feeling of  unsteadiness or light-headedness. This condition can be caused by a number of things, including medicines, dehydration, or illness. ?Anyone can become dizzy, but dizziness is more common in older adults. ?Drink enough fluid to keep your urine pale yellow. Do not drink alcohol. ?Avoid making quick movements if you feel dizzy. Monitor your dizziness for any changes. ?This information is not intended to replace advice given to you by your health care provider. Make sure you discuss any questions you have with your health care provider. ?Document Revised: 02/24/2020 Document Reviewed: 02/24/2020 ?Elsevier Patient Education ? 2023 [employer] ? ?

## 2021-07-21 NOTE — Assessment & Plan Note (Addendum)
Benign neurological exam ?Possible etiologies include starting new medication for blood pressure or BPPV.  Last dizzy spell was yesterday.  Meclizine prescribed in case dizziness returns.  He will change positions slowly, stay well-hydrated and avoid skipping breakfast.  Follow-up if symptoms are worsening or he is having to take the meclizine for more than 1 week. ?

## 2021-07-21 NOTE — Assessment & Plan Note (Signed)
Blood pressure well controlled.  Unclear as to whether the new medication may have triggered dizzy spells.  He will continue on the Atacand and follow-up as recommended by Dr. Ronnald Ramp. ?

## 2021-07-27 NOTE — Progress Notes (Signed)
Patient consent obtained. ?Irrigation with water and peroxide performed. After irrigation was able to finish by removing cerumen with curette. Full view of tympanic membrane after procedure.  ?Patient tolerated procedure well.   ?

## 2021-08-09 IMAGING — US US RENAL
1 series · 14 of 25 positions shown · non-contrast
Comparison: None.

CLINICAL DATA: Stage III CKD

EXAM:
RENAL / URINARY TRACT ULTRASOUND COMPLETE

[Series 1: us renal · 0.23mm/px · 14 of 39 slices shown]
[im 1/39]
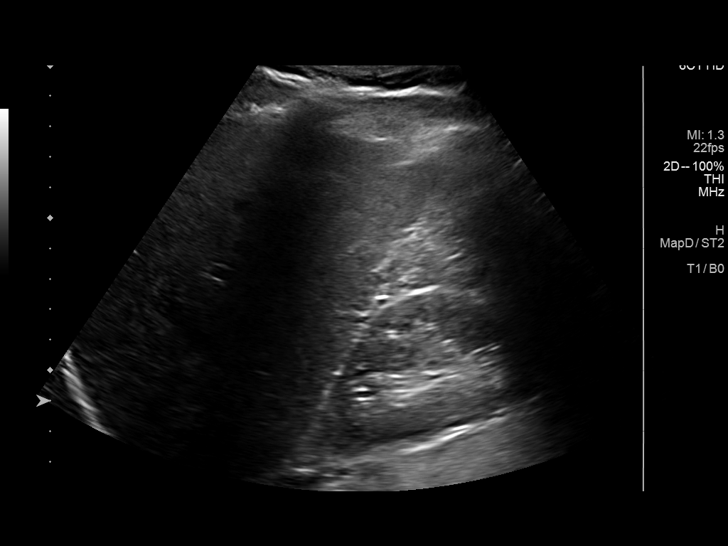
[im 4/39]
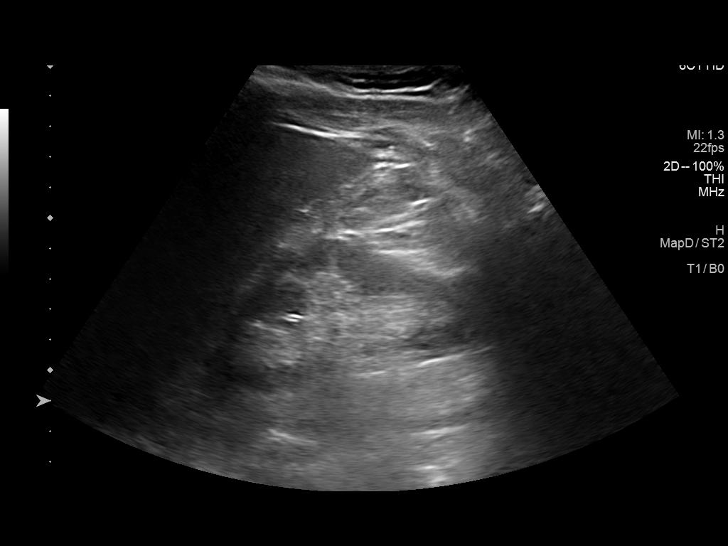
[im 7/39]
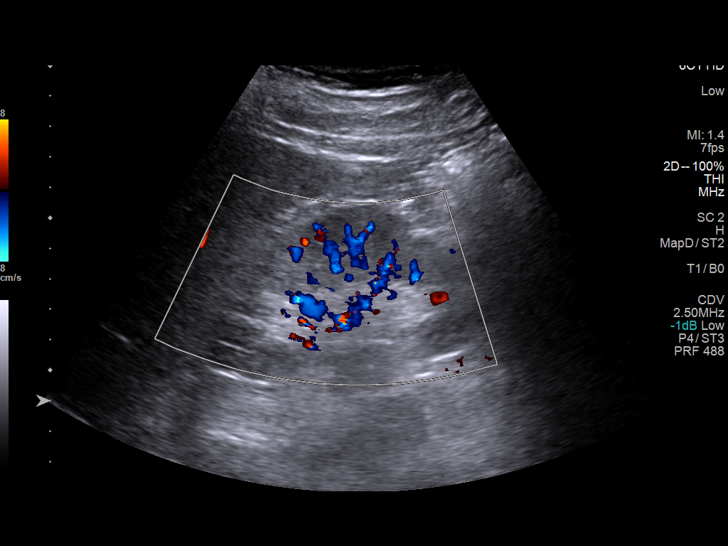
[im 10/39]
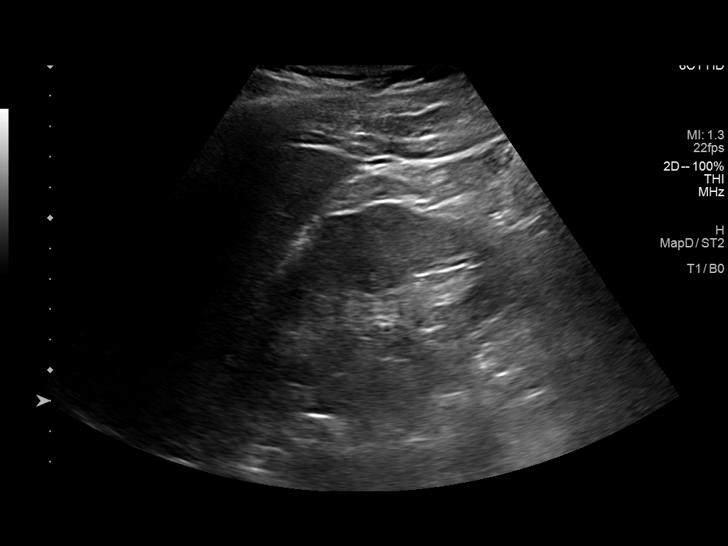
[im 13/39]
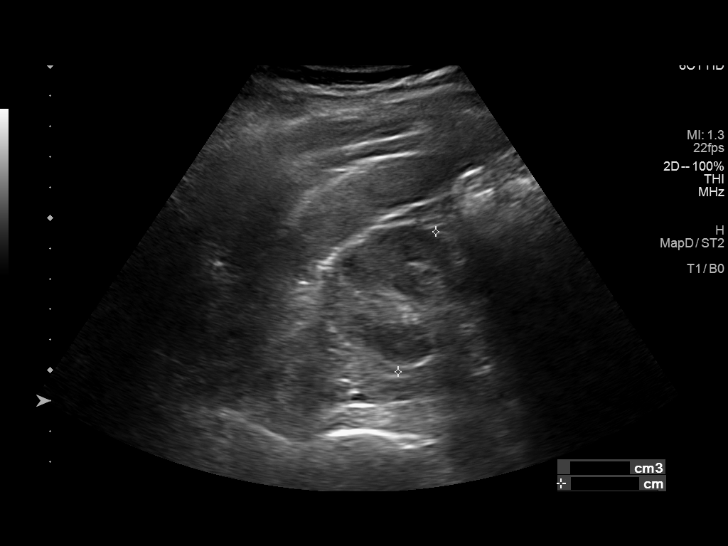
[im 15/39]
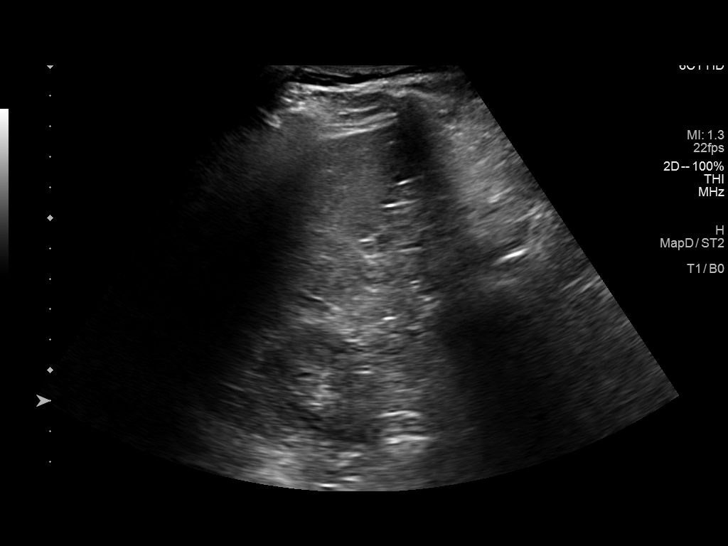
[im 18/39]
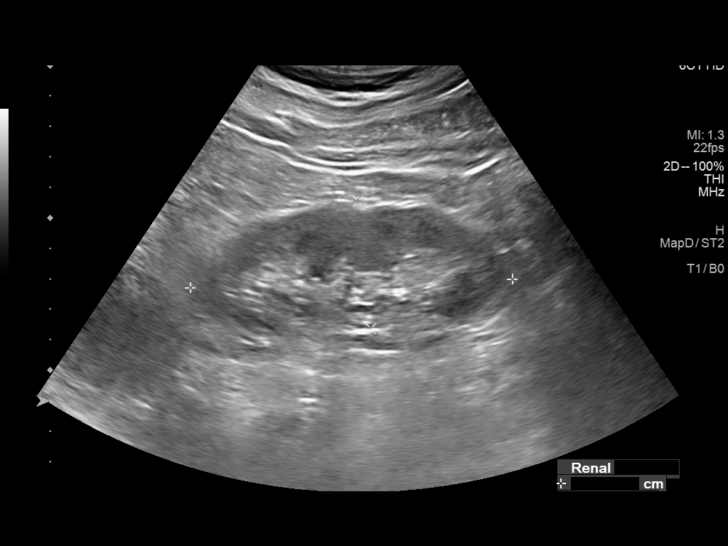
[im 21/39]
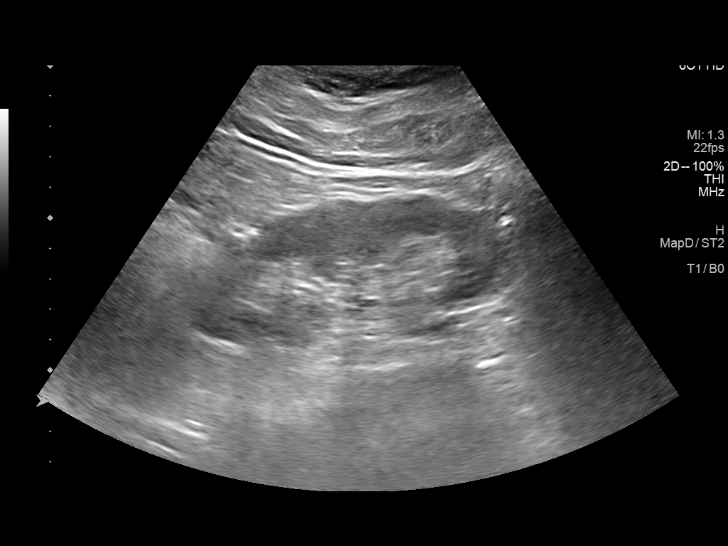
[im 24/39]
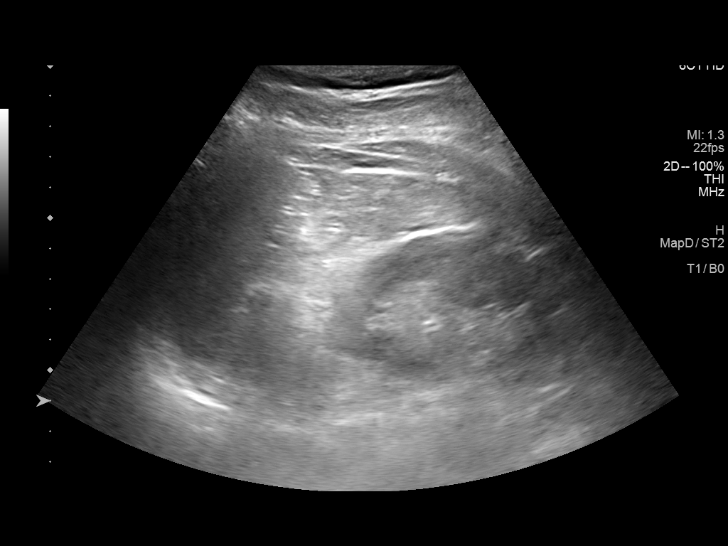
[im 26/39]
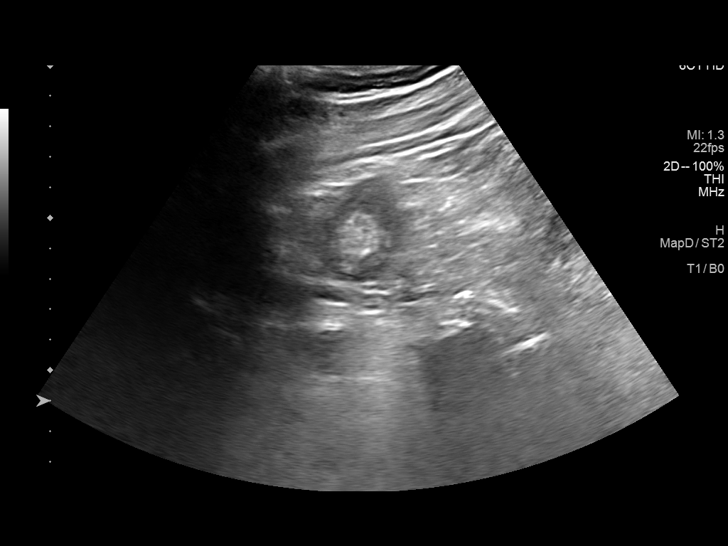
[im 29/39]
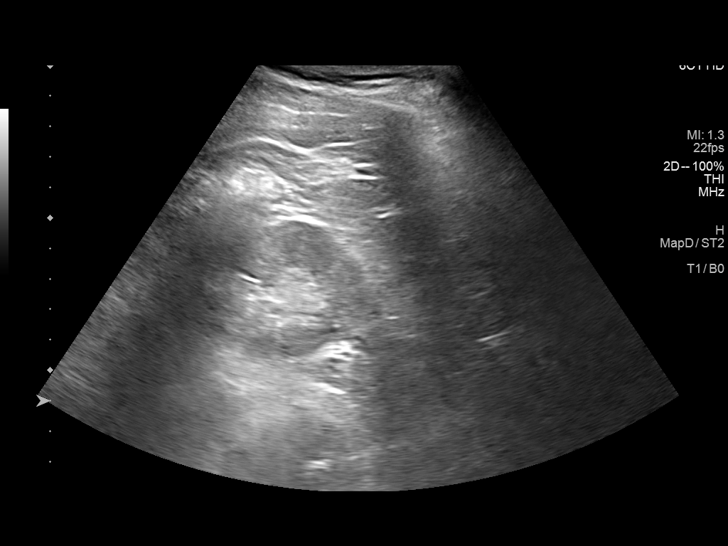
[im 32/39]
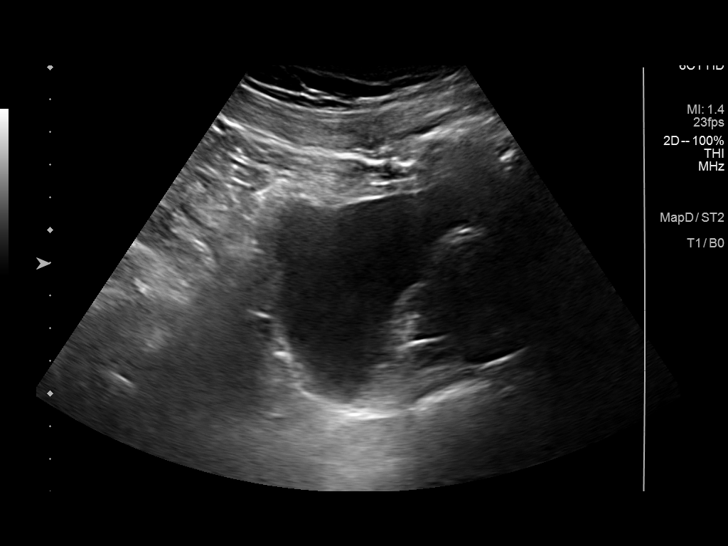
[im 35/39]
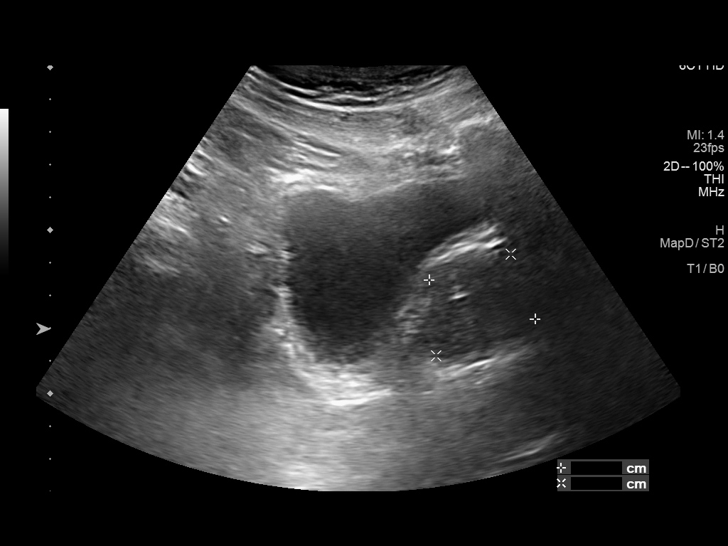
[im 39/39]
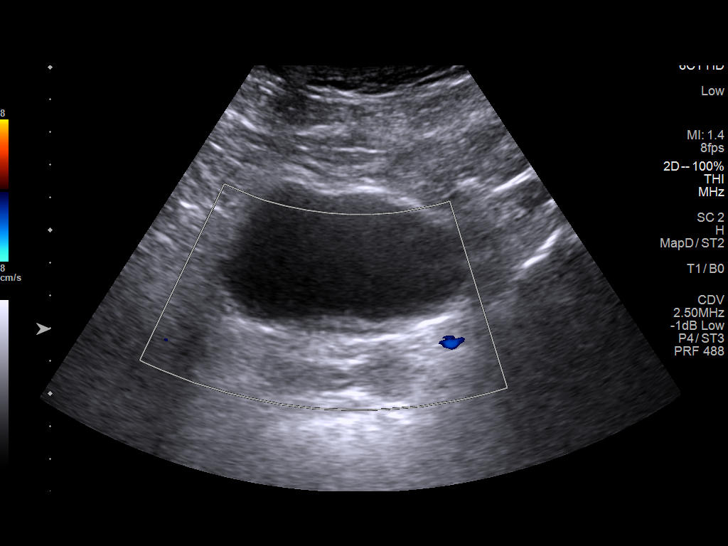

[14 of 25 positions shown; findings below may reference images not displayed]

FINDINGS: Right Kidney:

Renal measurements: 10.2 x 6.0 x 4.8 cm = volume: 152 mL.
Echogenicity within normal limits. No mass or hydronephrosis
visualized.

Left Kidney:

Renal measurements: 10.6 x 4.2 x 4.5 cm = volume: 105 mL.
Echogenicity within normal limits. No mass or hydronephrosis
visualized.

Bladder:

Appears normal for degree of bladder distention.

Other:

Prostate size at the upper limits of normal.
IMPRESSION: Unremarkable sonographic appearance of the bilateral kidneys and
bladder.

## 2021-12-27 ENCOUNTER — Other Ambulatory Visit: Payer: Self-pay | Admitting: Internal Medicine

## 2021-12-27 DIAGNOSIS — E785 Hyperlipidemia, unspecified: Secondary | ICD-10-CM

## 2022-01-03 ENCOUNTER — Other Ambulatory Visit: Payer: Self-pay | Admitting: Internal Medicine

## 2022-01-03 DIAGNOSIS — I10 Essential (primary) hypertension: Secondary | ICD-10-CM

## 2022-01-06 ENCOUNTER — Encounter: Payer: Self-pay | Admitting: Internal Medicine

## 2022-01-06 ENCOUNTER — Ambulatory Visit (INDEPENDENT_AMBULATORY_CARE_PROVIDER_SITE_OTHER): Payer: Medicare Other | Admitting: Internal Medicine

## 2022-01-06 VITALS — BP 136/86 | HR 85 | Temp 98.2°F | Ht 69.0 in | Wt 168.0 lb

## 2022-01-06 DIAGNOSIS — I10 Essential (primary) hypertension: Secondary | ICD-10-CM

## 2022-01-06 DIAGNOSIS — N1831 Chronic kidney disease, stage 3a: Secondary | ICD-10-CM | POA: Diagnosis not present

## 2022-01-06 LAB — URINALYSIS, ROUTINE W REFLEX MICROSCOPIC
Bilirubin Urine: NEGATIVE
Hgb urine dipstick: NEGATIVE
Ketones, ur: NEGATIVE
Leukocytes,Ua: NEGATIVE
Nitrite: NEGATIVE
RBC / HPF: NONE SEEN (ref 0–?)
Specific Gravity, Urine: 1.02 (ref 1.000–1.030)
Total Protein, Urine: NEGATIVE
Urine Glucose: NEGATIVE
Urobilinogen, UA: 0.2 (ref 0.0–1.0)
pH: 5.5 (ref 5.0–8.0)

## 2022-01-06 LAB — CBC WITH DIFFERENTIAL/PLATELET
Basophils Absolute: 0.1 10*3/uL (ref 0.0–0.1)
Basophils Relative: 0.9 % (ref 0.0–3.0)
Eosinophils Absolute: 0.6 10*3/uL (ref 0.0–0.7)
Eosinophils Relative: 9.6 % — ABNORMAL HIGH (ref 0.0–5.0)
HCT: 43.7 % (ref 39.0–52.0)
Hemoglobin: 14.7 g/dL (ref 13.0–17.0)
Lymphocytes Relative: 15.8 % (ref 12.0–46.0)
Lymphs Abs: 1 10*3/uL (ref 0.7–4.0)
MCHC: 33.6 g/dL (ref 30.0–36.0)
MCV: 91.4 fl (ref 78.0–100.0)
Monocytes Absolute: 0.5 10*3/uL (ref 0.1–1.0)
Monocytes Relative: 8.7 % (ref 3.0–12.0)
Neutro Abs: 4.1 10*3/uL (ref 1.4–7.7)
Neutrophils Relative %: 65 % (ref 43.0–77.0)
Platelets: 216 10*3/uL (ref 150.0–400.0)
RBC: 4.79 Mil/uL (ref 4.22–5.81)
RDW: 13 % (ref 11.5–15.5)
WBC: 6.3 10*3/uL (ref 4.0–10.5)

## 2022-01-06 LAB — BASIC METABOLIC PANEL
BUN: 22 mg/dL (ref 6–23)
CO2: 29 mEq/L (ref 19–32)
Calcium: 9.7 mg/dL (ref 8.4–10.5)
Chloride: 102 mEq/L (ref 96–112)
Creatinine, Ser: 1.68 mg/dL — ABNORMAL HIGH (ref 0.40–1.50)
GFR: 41.64 mL/min — ABNORMAL LOW (ref >60.00)
Glucose, Bld: 80 mg/dL (ref 70–99)
Potassium: 4 mEq/L (ref 3.5–5.1)
Sodium: 138 mEq/L (ref 135–145)

## 2022-01-06 NOTE — Patient Instructions (Signed)
Hypertension, Adult High blood pressure (hypertension) is when the force of blood pumping through the arteries is too strong. The arteries are the blood vessels that carry blood from the heart throughout the body. Hypertension forces the heart to work harder to pump blood and may cause arteries to become narrow or stiff. Untreated or uncontrolled hypertension can lead to a heart attack, heart failure, a stroke, kidney disease, and other problems. A blood pressure reading consists of a higher number over a lower number. Ideally, your blood pressure should be below 120/80. The first ("top") number is called the systolic pressure. It is a measure of the pressure in your arteries as your heart beats. The second ("bottom") number is called the diastolic pressure. It is a measure of the pressure in your arteries as the heart relaxes. What are the causes? The exact cause of this condition is not known. There are some conditions that result in high blood pressure. What increases the risk? Certain factors may make you more likely to develop high blood pressure. Some of these risk factors are under your control, including: Smoking. Not getting enough exercise or physical activity. Being overweight. Having too much fat, sugar, calories, or salt (sodium) in your diet. Drinking too much alcohol. Other risk factors include: Having a personal history of heart disease, diabetes, high cholesterol, or kidney disease. Stress. Having a family history of high blood pressure and high cholesterol. Having obstructive sleep apnea. Age. The risk increases with age. What are the signs or symptoms? High blood pressure may not cause symptoms. Very high blood pressure (hypertensive crisis) may cause: Headache. Fast or irregular heartbeats (palpitations). Shortness of breath. Nosebleed. Nausea and vomiting. Vision changes. Severe chest pain, dizziness, and seizures. How is this diagnosed? This condition is diagnosed by  measuring your blood pressure while you are seated, with your arm resting on a flat surface, your legs uncrossed, and your feet flat on the floor. The cuff of the blood pressure monitor will be placed directly against the skin of your upper arm at the level of your heart. Blood pressure should be measured at least twice using the same arm. Certain conditions can cause a difference in blood pressure between your right and left arms. If you have a high blood pressure reading during one visit or you have normal blood pressure with other risk factors, you may be asked to: Return on a different day to have your blood pressure checked again. Monitor your blood pressure at home for 1 week or longer. If you are diagnosed with hypertension, you may have other blood or imaging tests to help your health care provider understand your overall risk for other conditions. How is this treated? This condition is treated by making healthy lifestyle changes, such as eating healthy foods, exercising more, and reducing your alcohol intake. You may be referred for counseling on a healthy diet and physical activity. Your health care provider may prescribe medicine if lifestyle changes are not enough to get your blood pressure under control and if: Your systolic blood pressure is above 130. Your diastolic blood pressure is above 80. Your personal target blood pressure may vary depending on your medical conditions, your age, and other factors. Follow these instructions at home: Eating and drinking  Eat a diet that is high in fiber and potassium, and low in sodium, added sugar, and fat. An example of this eating plan is called the DASH diet. DASH stands for Dietary Approaches to Stop Hypertension. To eat this way: Eat   plenty of fresh fruits and vegetables. Try to fill one half of your plate at each meal with fruits and vegetables. Eat whole grains, such as whole-wheat pasta, brown rice, or whole-grain bread. Fill about one  fourth of your plate with whole grains. Eat or drink low-fat dairy products, such as skim milk or low-fat yogurt. Avoid fatty cuts of meat, processed or cured meats, and poultry with skin. Fill about one fourth of your plate with lean proteins, such as fish, chicken without skin, beans, eggs, or tofu. Avoid pre-made and processed foods. These tend to be higher in sodium, added sugar, and fat. Reduce your daily sodium intake. Many people with hypertension should eat less than 1,500 mg of sodium a day. Do not drink alcohol if: Your health care provider tells you not to drink. You are pregnant, may be pregnant, or are planning to become pregnant. If you drink alcohol: Limit how much you have to: 0-1 drink a day for women. 0-2 drinks a day for men. Know how much alcohol is in your drink. In the [place] one drink equals one 12 oz bottle of beer (355 mL), one 5 oz glass of wine (148 mL), or one 1 oz glass of hard liquor (44 mL). Lifestyle  Work with your health care provider to maintain a healthy body weight or to lose weight. Ask what an ideal weight is for you. Get at least 30 minutes of exercise that causes your heart to beat faster (aerobic exercise) most days of the week. Activities may include walking, swimming, or biking. Include exercise to strengthen your muscles (resistance exercise), such as Pilates or lifting weights, as part of your weekly exercise routine. Try to do these types of exercises for 30 minutes at least 3 days a week. Do not use any products that contain nicotine or tobacco. These products include cigarettes, chewing tobacco, and vaping devices, such as e-cigarettes. If you need help quitting, ask your health care provider. Monitor your blood pressure at home as told by your health care provider. Keep all follow-up visits. This is important. Medicines Take over-the-counter and prescription medicines only as told by your health care provider. Follow directions carefully. Blood  pressure medicines must be taken as prescribed. Do not skip doses of blood pressure medicine. Doing this puts you at risk for problems and can make the medicine less effective. Ask your health care provider about side effects or reactions to medicines that you should watch for. Contact a health care provider if you: Think you are having a reaction to a medicine you are taking. Have headaches that keep coming back (recurring). Feel dizzy. Have swelling in your ankles. Have trouble with your vision. Get help right away if you: Develop a severe headache or confusion. Have unusual weakness or numbness. Feel faint. Have severe pain in your chest or abdomen. Vomit repeatedly. Have trouble breathing. These symptoms may be an emergency. Get help right away. Call 911. Do not wait to see if the symptoms will go away. Do not drive yourself to the hospital. Summary Hypertension is when the force of blood pumping through your arteries is too strong. If this condition is not controlled, it may put you at risk for serious complications. Your personal target blood pressure may vary depending on your medical conditions, your age, and other factors. For most people, a normal blood pressure is less than 120/80. Hypertension is treated with lifestyle changes, medicines, or a combination of both. Lifestyle changes include losing weight, eating a healthy,   low-sodium diet, exercising more, and limiting alcohol. This information is not intended to replace advice given to you by your health care provider. Make sure you discuss any questions you have with your health care provider. Document Revised: 01/26/2021 Document Reviewed: 01/26/2021 Elsevier Patient Education  2023 Elsevier Inc.  

## 2022-01-06 NOTE — Progress Notes (Signed)
Subjective:  Patient ID: [name] Lambert, male    DOB: Nov 25, 1955  Age: 66 y.o. MRN: [id number]  CC: Hypertension   HPI [name] Lambert presents for f/up -  He tells me that he is taking mobic that was prescribed by a podiatrist. He is active and denies DOE, CP, SOB, edema.  Outpatient Medications Prior to Visit  Medication Sig Dispense Refill   candesartan (ATACAND) 16 MG tablet Take 1 tablet (16 mg total) by mouth daily. 90 tablet 1   rosuvastatin (CRESTOR) 10 MG tablet TAKE 1 TABLET(10 MG) BY MOUTH DAILY 90 tablet 1   meclizine (ANTIVERT) 25 MG tablet Take 1 tablet (25 mg total) by mouth 2 (two) times daily as needed for dizziness. 30 tablet 0   No facility-administered medications prior to visit.    ROS Review of Systems  Constitutional: Negative.  Negative for diaphoresis and fatigue.  HENT: Negative.    Eyes: Negative.   Respiratory:  Negative for cough, chest tightness, shortness of breath and wheezing.   Cardiovascular:  Negative for chest pain, palpitations and leg swelling.  Gastrointestinal:  Negative for abdominal pain, constipation, diarrhea, nausea and vomiting.  Endocrine: Negative.   Genitourinary: Negative.  Negative for difficulty urinating.  Musculoskeletal:  Positive for arthralgias. Negative for back pain and myalgias.  Skin: Negative.   Neurological: Negative.  Negative for dizziness and weakness.  Hematological:  Negative for adenopathy. Does not bruise/bleed easily.  Psychiatric/Behavioral: Negative.      Objective:  BP 136/86 (BP Location: Left Arm, Patient Position: Sitting, Cuff Size: Large)   Pulse 85   Temp 98.2 F (36.8 C) (Oral)   Ht 5\' 9"  (1.753 m)   Wt 168 lb (76.2 kg)   SpO2 98%   BMI 24.81 kg/m   BP Readings from Last 3 Encounters:  01/06/22 136/86  07/21/21 128/72  07/01/21 136/84    Wt Readings from Last 3 Encounters:  01/06/22 168 lb (76.2 kg)  07/21/21 166 lb 9.6 oz (75.6 kg)  07/01/21 169 lb (76.7 kg)    Physical  Exam Vitals reviewed.  HENT:     Nose: Nose normal.     Mouth/Throat:     Mouth: Mucous membranes are moist.  Eyes:     General: No scleral icterus.    Conjunctiva/sclera: Conjunctivae normal.  Cardiovascular:     Rate and Rhythm: Normal rate and regular rhythm.     Heart sounds: No murmur heard. Pulmonary:     Effort: Pulmonary effort is normal.     Breath sounds: No stridor. No wheezing, rhonchi or rales.  Abdominal:     General: Abdomen is flat.     Palpations: There is no mass.     Tenderness: There is no abdominal tenderness. There is no guarding.     Hernia: No hernia is present.  Musculoskeletal:        General: Normal range of motion.     Cervical back: Neck supple.     Right lower leg: No edema.     Left lower leg: No edema.  Lymphadenopathy:     Cervical: No cervical adenopathy.  Skin:    General: Skin is warm and dry.  Neurological:     General: No focal deficit present.     Mental Status: He is alert.  Psychiatric:        Mood and Affect: Mood normal.        Behavior: Behavior normal.     Lab Results  Component Value Date  WBC 6.3 01/06/2022   HGB 14.7 01/06/2022   HCT 43.7 01/06/2022   PLT 216.0 01/06/2022   GLUCOSE 80 01/06/2022   CHOL 142 07/01/2021   TRIG 100.0 07/01/2021   HDL 48.10 07/01/2021   LDLDIRECT 140.0 07/06/2017   LDLCALC 74 07/01/2021   ALT 28 07/01/2021   AST 23 07/01/2021   NA 138 01/06/2022   K 4.0 01/06/2022   CL 102 01/06/2022   CREATININE 1.68 (H) 01/06/2022   BUN 22 01/06/2022   CO2 29 01/06/2022   TSH 1.55 07/01/2021   PSA 2.31 06/09/2020    US RENAL  Result Date: 08/11/2020 CLINICAL DATA:  Stage III CKD EXAM: RENAL / URINARY TRACT ULTRASOUND COMPLETE COMPARISON:  None. FINDINGS: Right Kidney: Renal measurements: 10.2 x 6.0 x 4.8 cm = volume: 152 mL. Echogenicity within normal limits. No mass or hydronephrosis visualized. Left Kidney: Renal measurements: 10.6 x 4.2 x 4.5 cm = volume: 105 mL. Echogenicity within  normal limits. No mass or hydronephrosis visualized. Bladder: Appears normal for degree of bladder distention. Other: Prostate size at the upper limits of normal. IMPRESSION: Unremarkable sonographic appearance of the bilateral kidneys and bladder. Electronically Signed   By: Audie Pinto M.D.   On: 08/11/2020 09:16    Assessment & Plan:   Tra was seen today for hypertension.  Diagnoses and all orders for this visit:  Stage 3a chronic kidney disease ([place])- His renal function has declined.  I have asked him to stop taking NSAIDs. -     Basic metabolic panel; Future -     CBC with Differential/Platelet; Future -     Urinalysis, Routine w reflex microscopic; Future -     Urinalysis, Routine w reflex microscopic -     CBC with Differential/Platelet -     Basic metabolic panel  Primary hypertension- His blood pressure is adequately well controlled. -     Basic metabolic panel; Future -     CBC with Differential/Platelet; Future -     Urinalysis, Routine w reflex microscopic; Future -     Urinalysis, Routine w reflex microscopic -     CBC with Differential/Platelet -     Basic metabolic panel   I have discontinued [name]'s meclizine. I am also having him maintain his candesartan and rosuvastatin.  No orders of the defined types were placed in this encounter.    Follow-up: Return in about 6 months (around 07/08/2022).  [name] Calico, MD

## 2022-01-10 ENCOUNTER — Telehealth: Payer: Self-pay | Admitting: Internal Medicine

## 2022-01-10 DIAGNOSIS — I10 Essential (primary) hypertension: Secondary | ICD-10-CM

## 2022-01-10 MED ORDER — CANDESARTAN CILEXETIL 16 MG PO TABS: 16.0000 mg | ORAL_TABLET | Freq: Every day | ORAL | 1 refills | Status: DC

## 2022-01-10 NOTE — Telephone Encounter (Signed)
Patient was here on 10/5 and did not get his candesartan called in - Please call in to [facility] on [facility].

## 2022-01-28 ENCOUNTER — Other Ambulatory Visit: Payer: Self-pay | Admitting: Internal Medicine

## 2022-01-28 ENCOUNTER — Telehealth: Payer: Self-pay | Admitting: Internal Medicine

## 2022-01-28 DIAGNOSIS — H8110 Benign paroxysmal vertigo, unspecified ear: Secondary | ICD-10-CM | POA: Insufficient documentation

## 2022-01-28 MED ORDER — MECLIZINE HCL 25 MG PO TABS: 25.0000 mg | ORAL_TABLET | Freq: Three times a day (TID) | ORAL | 2 refills | Status: DC | PRN

## 2022-01-28 NOTE — Telephone Encounter (Signed)
Pt called to report he is having dizziness again today. He is requesting provider call in the medication he gave him before for dizziness.  NEW PHARMACY:  [facility]  Pt phone: [phone]  Last visit: 01/06/22, 07/21/21

## 2022-02-15 ENCOUNTER — Telehealth: Payer: Self-pay | Admitting: Internal Medicine

## 2022-02-15 NOTE — Telephone Encounter (Signed)
Left message for patient to call back to schedule Medicare Annual Wellness Visit  ? ?No hx of AWV eligible as of 12/04/19 ? ?Please schedule at anytime with [facility] if patient calls the office back.   ? ? ?Any questions, please call me at [phone]  ?

## 2022-02-21 ENCOUNTER — Ambulatory Visit: Payer: Medicare Other

## 2022-03-16 ENCOUNTER — Telehealth: Payer: Self-pay | Admitting: Internal Medicine

## 2022-03-16 NOTE — Telephone Encounter (Signed)
Form given to PCP to review and sign.  

## 2022-03-16 NOTE — Telephone Encounter (Signed)
For our records:  We have received Pre-Op PW for the pt from [facility] and it has been placed in Dr. Yetta Barre' boxes.   Upon completion please fax to: [phone]

## 2022-03-17 NOTE — Telephone Encounter (Signed)
Form has been signed and faxed back to [facility]

## 2022-05-09 ENCOUNTER — Telehealth: Payer: Self-pay | Admitting: Internal Medicine

## 2022-05-09 NOTE — Telephone Encounter (Signed)
For our records:  We have received Pre-Op PW for the pt and it has been placed in Dr. Jones boxes.   Upon completion please fax to: [phone] 

## 2022-05-10 NOTE — Telephone Encounter (Signed)
Form given to PCP to review and sign.  

## 2022-05-11 NOTE — Telephone Encounter (Signed)
Form has been signed and faxed back 

## 2022-06-13 DIAGNOSIS — M25522 Pain in left elbow: Secondary | ICD-10-CM | POA: Insufficient documentation

## 2022-06-20 DIAGNOSIS — S42409A Unspecified fracture of lower end of unspecified humerus, initial encounter for closed fracture: Secondary | ICD-10-CM | POA: Insufficient documentation

## 2022-06-24 DIAGNOSIS — G8918 Other acute postprocedural pain: Secondary | ICD-10-CM | POA: Insufficient documentation

## 2022-06-27 ENCOUNTER — Other Ambulatory Visit: Payer: Self-pay | Admitting: Internal Medicine

## 2022-06-27 DIAGNOSIS — E785 Hyperlipidemia, unspecified: Secondary | ICD-10-CM

## 2022-07-13 ENCOUNTER — Other Ambulatory Visit: Payer: Self-pay | Admitting: Internal Medicine

## 2022-07-13 DIAGNOSIS — I10 Essential (primary) hypertension: Secondary | ICD-10-CM

## 2022-08-11 ENCOUNTER — Other Ambulatory Visit: Payer: Self-pay | Admitting: Internal Medicine

## 2022-08-11 DIAGNOSIS — I10 Essential (primary) hypertension: Secondary | ICD-10-CM

## 2022-08-31 ENCOUNTER — Telehealth: Payer: Self-pay | Admitting: Radiology

## 2022-08-31 NOTE — Telephone Encounter (Signed)
Left voice mail for patient to call back at [phone] to schedule Medicare Annual Wellness Visit    Last AWV:  no Hx of AWV    Please schedule Sequential/Initial AWV with [facility]. CMA

## 2022-09-07 ENCOUNTER — Other Ambulatory Visit: Payer: Self-pay | Admitting: Internal Medicine

## 2022-09-07 ENCOUNTER — Telehealth: Payer: Self-pay | Admitting: Internal Medicine

## 2022-09-07 DIAGNOSIS — I10 Essential (primary) hypertension: Secondary | ICD-10-CM

## 2022-09-07 MED ORDER — CANDESARTAN CILEXETIL 16 MG PO TABS: 16.0000 mg | ORAL_TABLET | Freq: Every day | ORAL | 0 refills | Status: DC

## 2022-09-07 NOTE — Telephone Encounter (Signed)
Prescription Request  09/07/2022  LOV: 01/06/2022  What is the name of the medication or equipment? candesartan  Have you contacted your pharmacy to request a refill? Yes   Which pharmacy would you like this sent to?   [id number] [place], [place] - [address] AT [facility] OF [address] & [facility] [address] [place] [address] Phone: [phone] Fax: [phone]    Patient notified that their request is being sent to the clinical staff for review and that they should receive a response within 2 business days.   Please advise at Mobile [phone] (mobile)

## 2022-09-10 ENCOUNTER — Other Ambulatory Visit: Payer: Self-pay | Admitting: Internal Medicine

## 2022-09-10 DIAGNOSIS — I10 Essential (primary) hypertension: Secondary | ICD-10-CM

## 2022-09-21 ENCOUNTER — Ambulatory Visit: Payer: Medicare Other | Admitting: Internal Medicine

## 2022-10-05 ENCOUNTER — Ambulatory Visit (INDEPENDENT_AMBULATORY_CARE_PROVIDER_SITE_OTHER): Payer: Medicare Other

## 2022-10-05 VITALS — Ht 69.0 in | Wt 168.0 lb

## 2022-10-05 DIAGNOSIS — Z0000 Encounter for general adult medical examination without abnormal findings: Secondary | ICD-10-CM
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
## 2022-10-05 NOTE — Patient Instructions (Signed)
Mr. [name] Lambert , Thank you for taking time to come for your Medicare Wellness Visit. I appreciate your ongoing commitment to your health goals. Please review the following plan we discussed and let me know if I can assist you in the future.   These are the goals we discussed:  Goals      My goal for 2024 is to get well.        This is a list of the screening recommended for you and due dates:  Health Maintenance  Topic Date Due   Zoster (Shingles) Vaccine (1 of 2) Never done   Flu Shot  11/03/2022   Medicare Annual Wellness Visit  10/05/2023   Cologuard (Stool DNA test)  12/26/2023   DTaP/Tdap/Td vaccine (2 - Td or Tdap) 07/07/2027   Pneumonia Vaccine  Completed   Hepatitis C Screening  Completed   HPV Vaccine  Aged Out   COVID-19 Vaccine  Discontinued    Advanced directives: No  Conditions/risks identified: Yes  Next appointment: Follow up in one year for your annual wellness visit in 1 year.  Preventive Care 69 Years and Older, Male  Preventive care refers to lifestyle choices and visits with your health care provider that can promote health and wellness. What does preventive care include? A yearly physical exam. This is also called an annual well check. Dental exams once or twice a year. Routine eye exams. Ask your health care provider how often you should have your eyes checked. Personal lifestyle choices, including: Daily care of your teeth and gums. Regular physical activity. Eating a healthy diet. Avoiding tobacco and drug use. Limiting alcohol use. Practicing safe sex. Taking low doses of aspirin every day. Taking vitamin and mineral supplements as recommended by your health care provider. What happens during an annual well check? The services and screenings done by your health care provider during your annual well check will depend on your age, overall health, lifestyle risk factors, and family history of disease. Counseling  Your health care provider may ask  you questions about your: Alcohol use. Tobacco use. Drug use. Emotional well-being. Home and relationship well-being. Sexual activity. Eating habits. History of falls. Memory and ability to understand (cognition). Work and work Astronomer. Screening  You may have the following tests or measurements: Height, weight, and BMI. Blood pressure. Lipid and cholesterol levels. These may be checked every 5 years, or more frequently if you are over 45 years old. Skin check. Lung cancer screening. You may have this screening every year starting at age 69 if you have a 30-pack-year history of smoking and currently smoke or have quit within the past 15 years. Fecal occult blood test (FOBT) of the stool. You may have this test every year starting at age 69. Flexible sigmoidoscopy or colonoscopy. You may have a sigmoidoscopy every 5 years or a colonoscopy every 10 years starting at age 69. Prostate cancer screening. Recommendations will vary depending on your family history and other risks. Hepatitis C blood test. Hepatitis B blood test. Sexually transmitted disease (STD) testing. Diabetes screening. This is done by checking your blood sugar (glucose) after you have not eaten for a while (fasting). You may have this done every 1-3 years. Abdominal aortic aneurysm (AAA) screening. You may need this if you are a current or former smoker. Osteoporosis. You may be screened starting at age 75 if you are at high risk. Talk with your health care provider about your test results, treatment options, and if necessary, the need for  more tests. Vaccines  Your health care provider may recommend certain vaccines, such as: Influenza vaccine. This is recommended every year. Tetanus, diphtheria, and acellular pertussis (Tdap, Td) vaccine. You may need a Td booster every 10 years. Zoster vaccine. You may need this after age 69. Pneumococcal 13-valent conjugate (PCV13) vaccine. One dose is recommended after age  69. Pneumococcal polysaccharide (PPSV23) vaccine. One dose is recommended after age 69. Talk to your health care provider about which screenings and vaccines you need and how often you need them. This information is not intended to replace advice given to you by your health care provider. Make sure you discuss any questions you have with your health care provider. Document Released: 04/17/2015 Document Revised: 12/09/2015 Document Reviewed: 01/20/2015 Elsevier Interactive Patient Education  2017 [place] Prevention in the Home Falls can cause injuries. They can happen to people of all ages. There are many things you can do to make your home safe and to help prevent falls. What can I do on the outside of my home? Regularly fix the edges of walkways and driveways and fix any cracks. Remove anything that might make you trip as you walk through a door, such as a raised step or threshold. Trim any bushes or trees on the path to your home. Use bright outdoor lighting. Clear any walking paths of anything that might make someone trip, such as rocks or tools. Regularly check to see if handrails are loose or broken. Make sure that both sides of any steps have handrails. Any raised decks and porches should have guardrails on the edges. Have any leaves, snow, or ice cleared regularly. Use sand or salt on walking paths during winter. Clean up any spills in your garage right away. This includes oil or grease spills. What can I do in the bathroom? Use night lights. Install grab bars by the toilet and in the tub and shower. Do not use towel bars as grab bars. Use non-skid mats or decals in the tub or shower. If you need to sit down in the shower, use a plastic, non-slip stool. Keep the floor dry. Clean up any water that spills on the floor as soon as it happens. Remove soap buildup in the tub or shower regularly. Attach bath mats securely with double-sided non-slip rug tape. Do not have throw  rugs and other things on the floor that can make you trip. What can I do in the bedroom? Use night lights. Make sure that you have a light by your bed that is easy to reach. Do not use any sheets or blankets that are too big for your bed. They should not hang down onto the floor. Have a firm chair that has side arms. You can use this for support while you get dressed. Do not have throw rugs and other things on the floor that can make you trip. What can I do in the kitchen? Clean up any spills right away. Avoid walking on wet floors. Keep items that you use a lot in easy-to-reach places. If you need to reach something above you, use a strong step stool that has a grab bar. Keep electrical cords out of the way. Do not use floor polish or wax that makes floors slippery. If you must use wax, use non-skid floor wax. Do not have throw rugs and other things on the floor that can make you trip. What can I do with my stairs? Do not leave any items on the stairs. Make  sure that there are handrails on both sides of the stairs and use them. Fix handrails that are broken or loose. Make sure that handrails are as long as the stairways. Check any carpeting to make sure that it is firmly attached to the stairs. Fix any carpet that is loose or worn. Avoid having throw rugs at the top or bottom of the stairs. If you do have throw rugs, attach them to the floor with carpet tape. Make sure that you have a light switch at the top of the stairs and the bottom of the stairs. If you do not have them, ask someone to add them for you. What else can I do to help prevent falls? Wear shoes that: Do not have high heels. Have rubber bottoms. Are comfortable and fit you well. Are closed at the toe. Do not wear sandals. If you use a stepladder: Make sure that it is fully opened. Do not climb a closed stepladder. Make sure that both sides of the stepladder are locked into place. Ask someone to hold it for you, if  possible. Clearly mark and make sure that you can see: Any grab bars or handrails. First and last steps. Where the edge of each step is. Use tools that help you move around (mobility aids) if they are needed. These include: Canes. Walkers. Scooters. Crutches. Turn on the lights when you go into a dark area. Replace any light bulbs as soon as they burn out. Set up your furniture so you have a clear path. Avoid moving your furniture around. If any of your floors are uneven, fix them. If there are any pets around you, be aware of where they are. Review your medicines with your doctor. Some medicines can make you feel dizzy. This can increase your chance of falling. Ask your doctor what other things that you can do to help prevent falls. This information is not intended to replace advice given to you by your health care provider. Make sure you discuss any questions you have with your health care provider. Document Released: 01/15/2009 Document Revised: 08/27/2015 Document Reviewed: 04/25/2014 Elsevier Interactive Patient Education  2017 [employer].

## 2022-12-07 ENCOUNTER — Telehealth: Payer: Self-pay | Admitting: Internal Medicine

## 2022-12-07 ENCOUNTER — Other Ambulatory Visit: Payer: Self-pay | Admitting: Internal Medicine

## 2022-12-07 DIAGNOSIS — I10 Essential (primary) hypertension: Secondary | ICD-10-CM

## 2022-12-07 MED ORDER — CANDESARTAN CILEXETIL 16 MG PO TABS: 16.0000 mg | ORAL_TABLET | Freq: Every day | ORAL | 0 refills | Status: DC

## 2022-12-07 NOTE — Telephone Encounter (Signed)
Prescription Request  12/07/2022  LOV: 01/06/2022  What is the name of the medication or equipment? candesartan  Have you contacted your pharmacy to request a refill? Yes   Which pharmacy would you like this sent to?  [facility] #[id number] [place], [place] - [address] AT [facility] OF [address] & [facility] [address] [place] [address] Phone: [phone] Fax: [phone]    Patient notified that their request is being sent to the clinical staff for review and that they should receive a response within 2 business days.   Please advise at Mobile [phone] (mobile)

## 2023-01-05 ENCOUNTER — Other Ambulatory Visit: Payer: Self-pay | Admitting: Internal Medicine

## 2023-01-05 DIAGNOSIS — E785 Hyperlipidemia, unspecified: Secondary | ICD-10-CM

## 2023-01-10 ENCOUNTER — Ambulatory Visit: Payer: Medicare Other | Admitting: Internal Medicine

## 2023-03-06 ENCOUNTER — Other Ambulatory Visit: Payer: Self-pay | Admitting: Internal Medicine

## 2023-03-06 DIAGNOSIS — I10 Essential (primary) hypertension: Secondary | ICD-10-CM

## 2023-04-10 ENCOUNTER — Other Ambulatory Visit: Payer: Self-pay | Admitting: Internal Medicine

## 2023-04-10 DIAGNOSIS — E785 Hyperlipidemia, unspecified: Secondary | ICD-10-CM

## 2023-04-12 ENCOUNTER — Ambulatory Visit: Payer: Medicare Other | Admitting: Internal Medicine

## 2023-04-12 ENCOUNTER — Other Ambulatory Visit: Payer: Self-pay | Admitting: Internal Medicine

## 2023-04-12 NOTE — Telephone Encounter (Signed)
 Copied from CRM [id number]. Topic: Clinical - Medication Refill >> Apr 12, 2023  8:06 AM Leotis ORN wrote: Most Recent Primary Care Visit:  Provider: TOMIE ROZ SAILOR  Department: [facility]  Visit Type: MEDICARE AWV, INITIAL  Date: 10/05/2022  Medication: rosuvastatin  (CRESTOR ) 10 MG tablet  Has the patient contacted their pharmacy? No (Agent: If no, request that the patient contact the pharmacy for the refill. If patient does not wish to contact the pharmacy document the reason why and proceed with request.) (Agent: If yes, when and what did the pharmacy advise?)  Is this the correct pharmacy for this prescription? No If no, delete pharmacy and type the correct one.  This is the patient's preferred pharmacy:    [facility] #[id number] [place], [place] - [address] AT [facility] OF [address] & [facility] [address]  [place] [address] Phone: [phone] Fax: [phone]   Has the prescription been filled recently? Yes  Is the patient out of the medication? Yes  Has the patient been seen for an appointment in the last year OR does the patient have an upcoming appointment? Yes  Can we respond through MyChart? Yes  Agent: Please be advised that Rx refills may take up to 3 business days. We ask that you follow-up with your pharmacy.

## 2023-04-18 ENCOUNTER — Other Ambulatory Visit: Payer: Self-pay | Admitting: Internal Medicine

## 2023-04-18 DIAGNOSIS — E785 Hyperlipidemia, unspecified: Secondary | ICD-10-CM

## 2023-04-18 NOTE — Telephone Encounter (Signed)
 Copied from CRM [id number]. Topic: Clinical - Medication Refill >> Apr 13, 2023  2:24 PM [name] Lambert wrote: Most Recent Primary Care Visit:  Provider: TOMIE ROZ SAILOR  Department: [facility]  Visit Type: MEDICARE AWV, INITIAL  Date: 10/05/2022  Medication: rosuvastatin  (CRESTOR ) 10 MG tablet  Has the patient contacted their pharmacy? Yes (Agent: If no, request that the patient contact the pharmacy for the refill. If patient does not wish to contact the pharmacy document the reason why and proceed with request.) (Agent: If yes, when and what did the pharmacy advise?)  Is this the correct pharmacy for this prescription? Yes If no, delete pharmacy and type the correct one.  This is the patient's preferred pharmacy:    [facility] #[id number] [place], [place] - [address] AT [facility] OF [address] & [facility] [address] [place] [address] Phone: [phone] Fax: [phone]   Has the prescription been filled recently? Yes  Is the patient out of the medication? Yes  Has the patient been seen for an appointment in the last year OR does the patient have an upcoming appointment? Yes  Can we respond through MyChart? No  Agent: Please be advised that Rx refills may take up to 3 business days. We ask that you follow-up with your pharmacy.

## 2023-04-19 ENCOUNTER — Ambulatory Visit: Payer: Self-pay | Admitting: Internal Medicine

## 2023-04-19 ENCOUNTER — Other Ambulatory Visit: Payer: Self-pay | Admitting: Internal Medicine

## 2023-04-19 ENCOUNTER — Other Ambulatory Visit: Payer: Self-pay

## 2023-04-19 DIAGNOSIS — E785 Hyperlipidemia, unspecified: Secondary | ICD-10-CM

## 2023-04-19 MED ORDER — ROSUVASTATIN CALCIUM 10 MG PO TABS: 10.0000 mg | ORAL_TABLET | Freq: Every day | ORAL | 0 refills | Status: DC

## 2023-04-19 NOTE — Telephone Encounter (Signed)
 Spoke with patient, he is scheduled for 02/13 with Dr.Jones. Will send in a short supply to last patient until appointment. Patient very appreciative.

## 2023-04-19 NOTE — Telephone Encounter (Signed)
 Patient called in stating he is completely out of his Crestor  10 mg and pharmacist stated PCP denied refill. Patient is worried about being off of this medication until his follow up appointment with Dr. Rochelle Chu in February. Patient states "I don't know if it's due to the fact that I haven't seen Dr. Rochelle Chu in a year but I have had death in the families and been sick, which is why I haven't made it in". Patient curious if Dr. Rochelle Chu can approve refill at [facility] or if he will need to wait until his follow up appointment in February.  Copied from CRM [id number]. Topic: Clinical - Medication Question >> Apr 19, 2023 11:37 AM Jayson Michael wrote: Reason for CRM: rosuvastatin  (CRESTOR ) 10 MG tablet 0 refills left until his appt 05/18/23, pt would like to know if it will be okay to go without that Rx until he can get to his appt, Pt is afraid he will die without the medication Reason for Disposition  [1] Caller has URGENT medicine question about med that PCP or specialist prescribed AND [2] triager unable to answer question  Answer Assessment - Initial Assessment Questions 1. NAME of MEDICINE: "What medicine(s) are you calling about?"     Crestor  2. QUESTION: "What is your question?" (e.g., double dose of medicine, side effect)     Supposed to be coming in to see Dr. Rochelle Chu on Feb 13 - completely out of Crestor  and need it  3. PRESCRIBER: "Who prescribed the medicine?" Reason: if prescribed by specialist, call should be referred to that group.     Dr. Rochelle Chu  Protocols used: Medication Question Call-A-AH

## 2023-05-18 ENCOUNTER — Ambulatory Visit: Payer: Medicare Other | Admitting: Internal Medicine

## 2023-05-18 ENCOUNTER — Other Ambulatory Visit: Payer: Self-pay | Admitting: Internal Medicine

## 2023-05-18 DIAGNOSIS — E785 Hyperlipidemia, unspecified: Secondary | ICD-10-CM

## 2023-05-31 ENCOUNTER — Ambulatory Visit: Payer: Medicare Other | Admitting: Internal Medicine

## 2023-05-31 ENCOUNTER — Encounter: Payer: Self-pay | Admitting: Internal Medicine

## 2023-05-31 VITALS — BP 128/78 | HR 90 | Temp 97.7°F | Resp 16 | Ht 69.0 in | Wt 172.2 lb

## 2023-05-31 DIAGNOSIS — N1831 Chronic kidney disease, stage 3a: Secondary | ICD-10-CM

## 2023-05-31 DIAGNOSIS — I10 Essential (primary) hypertension: Secondary | ICD-10-CM | POA: Diagnosis not present

## 2023-05-31 DIAGNOSIS — N400 Enlarged prostate without lower urinary tract symptoms: Secondary | ICD-10-CM | POA: Diagnosis not present

## 2023-05-31 DIAGNOSIS — E785 Hyperlipidemia, unspecified: Secondary | ICD-10-CM

## 2023-05-31 LAB — CBC WITH DIFFERENTIAL/PLATELET
Basophils Absolute: 0.1 10*3/uL (ref 0.0–0.1)
Basophils Relative: 0.9 % (ref 0.0–3.0)
Eosinophils Absolute: 0.4 10*3/uL (ref 0.0–0.7)
Eosinophils Relative: 7.1 % — ABNORMAL HIGH (ref 0.0–5.0)
HCT: 44 % (ref 39.0–52.0)
Hemoglobin: 14.8 g/dL (ref 13.0–17.0)
Lymphocytes Relative: 16.7 % (ref 12.0–46.0)
Lymphs Abs: 1 10*3/uL (ref 0.7–4.0)
MCHC: 33.6 g/dL (ref 30.0–36.0)
MCV: 94.9 fL (ref 78.0–100.0)
Monocytes Absolute: 0.5 10*3/uL (ref 0.1–1.0)
Monocytes Relative: 8.7 % (ref 3.0–12.0)
Neutro Abs: 3.9 10*3/uL (ref 1.4–7.7)
Neutrophils Relative %: 66.6 % (ref 43.0–77.0)
Platelets: 181 10*3/uL (ref 150.0–400.0)
RBC: 4.63 Mil/uL (ref 4.22–5.81)
RDW: 13 % (ref 11.5–15.5)
WBC: 5.9 10*3/uL (ref 4.0–10.5)

## 2023-05-31 LAB — HEPATIC FUNCTION PANEL
ALT: 20 U/L (ref 0–53)
AST: 19 U/L (ref 0–37)
Albumin: 4.5 g/dL (ref 3.5–5.2)
Alkaline Phosphatase: 65 U/L (ref 39–117)
Bilirubin, Direct: 0.2 mg/dL (ref 0.0–0.3)
Total Bilirubin: 0.9 mg/dL (ref 0.2–1.2)
Total Protein: 6.7 g/dL (ref 6.0–8.3)

## 2023-05-31 LAB — LIPID PANEL
Cholesterol: 128 mg/dL (ref 0–200)
HDL: 48.5 mg/dL (ref >39.00)
LDL Cholesterol: 55 mg/dL (ref 0–99)
NonHDL: 79.47
Total CHOL/HDL Ratio: 3
Triglycerides: 121 mg/dL (ref 0.0–149.0)
VLDL: 24.2 mg/dL (ref 0.0–40.0)

## 2023-05-31 LAB — BASIC METABOLIC PANEL
BUN: 23 mg/dL (ref 6–23)
CO2: 26 meq/L (ref 19–32)
Calcium: 9 mg/dL (ref 8.4–10.5)
Chloride: 106 meq/L (ref 96–112)
Creatinine, Ser: 1.86 mg/dL — ABNORMAL HIGH (ref 0.40–1.50)
GFR: 36.49 mL/min — ABNORMAL LOW (ref >60.00)
Glucose, Bld: 88 mg/dL (ref 70–99)
Potassium: 4.4 meq/L (ref 3.5–5.1)
Sodium: 139 meq/L (ref 135–145)

## 2023-05-31 LAB — TSH: TSH: 2.03 u[IU]/mL (ref 0.35–5.50)

## 2023-05-31 LAB — PSA: PSA: 2.32 ng/mL (ref 0.10–4.00)

## 2023-05-31 NOTE — Progress Notes (Unsigned)
 Subjective:  Patient ID: [name] Lambert, male    DOB: [birth year]  Age: 69 y.o. MRN: [id number]  CC: Hypertension and Hyperlipidemia   HPI [name] Lambert presents for f/up ----  Discussed the use of AI scribe software for clinical note transcription with the patient, who gave verbal consent to proceed.  History of Present Illness   [name] Lambert "[name] Lambert" is a 68 year old male who presents for an annual physical exam.  He maintains an active lifestyle with no chest pain, shortness of breath, dizziness, or lightheadedness during physical activity. He recalls experiencing dizziness once about two to three weeks ago, which resolved after taking his medication.  He is currently taking candesartan for hypertension and rosuvastatin for hyperlipidemia, with no reported side effects such as muscle or joint aches. He experienced a single episode of dizziness approximately two to three weeks ago, which resolved without further issues.  He had knee surgery about a year ago followed by an elbow injury shortly thereafter.  He did not receive a flu shot last year and has no history of smoking. He denies any family history of colon or prostate cancer. His bowel movements are regular with no blood in the stool.       According to prescription refills he is not taking atacand.   Outpatient Medications Prior to Visit  Medication Sig Dispense Refill   candesartan (ATACAND) 16 MG tablet Take 1 tablet (16 mg total) by mouth daily. 90 tablet 0   meclizine (ANTIVERT) 25 MG tablet Take 1 tablet (25 mg total) by mouth 3 (three) times daily as needed for dizziness. 90 tablet 2   rosuvastatin (CRESTOR) 10 MG tablet TAKE 1 TABLET(10 MG) BY MOUTH DAILY 30 tablet 0   No facility-administered medications prior to visit.    ROS Review of Systems  Constitutional: Negative.  Negative for appetite change, diaphoresis, fatigue and unexpected weight change.  HENT: Negative.  Negative for sore throat and trouble  swallowing.   Eyes: Negative.   Respiratory: Negative.  Negative for cough, chest tightness, shortness of breath and wheezing.   Cardiovascular:  Negative for chest pain, palpitations and leg swelling.  Gastrointestinal:  Negative for abdominal pain, blood in stool, constipation, diarrhea, nausea and vomiting.  Genitourinary: Negative.  Negative for difficulty urinating and dysuria.  Musculoskeletal: Negative.  Negative for arthralgias and myalgias.  Skin: Negative.  Negative for color change.  Neurological:  Negative for dizziness, weakness and light-headedness.  Hematological:  Negative for adenopathy. Does not bruise/bleed easily.  Psychiatric/Behavioral:  Positive for confusion and decreased concentration. Negative for self-injury. The patient is not nervous/anxious.     Objective:  BP 128/78 (BP Location: Right Arm, Patient Position: Sitting, Cuff Size: Normal)   Pulse 90   Temp 97.7 F (36.5 C) (Oral)   Resp 16   Ht 5\' 9"  (1.753 m)   Wt 172 lb 3.2 oz (78.1 kg)   SpO2 97%   BMI 25.43 kg/m   BP Readings from Last 3 Encounters:  05/31/23 128/78  01/06/22 136/86  07/21/21 128/72    Wt Readings from Last 3 Encounters:  05/31/23 172 lb 3.2 oz (78.1 kg)  10/05/22 168 lb (76.2 kg)  01/06/22 168 lb (76.2 kg)    Physical Exam Vitals reviewed.  Constitutional:      Appearance: Normal appearance.  HENT:     Mouth/Throat:     Mouth: Mucous membranes are moist.  Eyes:     General: No scleral icterus.  Conjunctiva/sclera: Conjunctivae normal.  Cardiovascular:     Rate and Rhythm: Normal rate and regular rhythm.     Pulses: Normal pulses.     Heart sounds: No murmur heard.    No friction rub. No gallop.     Comments: EKG----  NSR, 85 bpm No LVH, Q waves, or ST/T wave changes  Pulmonary:     Effort: Pulmonary effort is normal.     Breath sounds: No stridor. No wheezing, rhonchi or rales.  Abdominal:     Palpations: There is no mass.     Tenderness: There is no  abdominal tenderness. There is no guarding.     Hernia: No hernia is present. There is no hernia in the left inguinal area or right inguinal area.  Genitourinary:    Pubic Area: No rash.      Penis: Normal and uncircumcised.      Testes: Normal.     Epididymis:     Right: Normal.     Left: Normal.     Prostate: Enlarged. Not tender and no nodules present.     Rectum: Normal. Guaiac result negative. No mass, tenderness, anal fissure, external hemorrhoid or internal hemorrhoid. Normal anal tone.  Musculoskeletal:        General: Normal range of motion.     Cervical back: Neck supple.     Right lower leg: No edema.     Left lower leg: No edema.  Lymphadenopathy:     Cervical: No cervical adenopathy.     Lower Body: No right inguinal adenopathy. No left inguinal adenopathy.  Skin:    General: Skin is warm and dry.     Coloration: Skin is not pale.  Neurological:     General: No focal deficit present.     Mental Status: He is alert. Mental status is at baseline.  Psychiatric:        Mood and Affect: Mood normal.        Behavior: Behavior normal.        Thought Content: Thought content normal.        Judgment: Judgment normal.     Lab Results  Component Value Date   WBC 5.9 05/31/2023   HGB 14.8 05/31/2023   HCT 44.0 05/31/2023   PLT 181.0 05/31/2023   GLUCOSE 88 05/31/2023   CHOL 128 05/31/2023   TRIG 121.0 05/31/2023   HDL 48.50 05/31/2023   LDLDIRECT 140.0 07/06/2017   LDLCALC 55 05/31/2023   ALT 20 05/31/2023   AST 19 05/31/2023   NA 139 05/31/2023   K 4.4 05/31/2023   CL 106 05/31/2023   CREATININE 1.86 (H) 05/31/2023   BUN 23 05/31/2023   CO2 26 05/31/2023   TSH 2.03 05/31/2023   PSA 2.32 05/31/2023    US RENAL Result Date: 08/11/2020 CLINICAL DATA:  Stage III CKD EXAM: RENAL / URINARY TRACT ULTRASOUND COMPLETE COMPARISON:  None. FINDINGS: Right Kidney: Renal measurements: 10.2 x 6.0 x 4.8 cm = volume: 152 mL. Echogenicity within normal limits. No mass or  hydronephrosis visualized. Left Kidney: Renal measurements: 10.6 x 4.2 x 4.5 cm = volume: 105 mL. Echogenicity within normal limits. No mass or hydronephrosis visualized. Bladder: Appears normal for degree of bladder distention. Other: Prostate size at the upper limits of normal. IMPRESSION: Unremarkable sonographic appearance of the bilateral kidneys and bladder. Electronically Signed   By: Emmaline Kluver M.D.   On: 08/11/2020 09:16    Assessment & Plan:  Hyperlipidemia with target LDL less than 130 -  Lipid panel; Future -     TSH; Future -     Hepatic function panel; Future -     Rosuvastatin Calcium; Take 1 tablet (10 mg total) by mouth daily.  Dispense: 90 tablet; Refill: 1 -     CT CARDIAC SCORING (SELF PAY ONLY); Future  Primary hypertension -     TSH; Future -     Urinalysis, Routine w reflex microscopic; Future -     CBC with Differential/Platelet; Future -     Basic metabolic panel; Future -     EKG 12-Lead  Stage 3a chronic kidney disease (HCC) -     Urinalysis, Routine w reflex microscopic; Future -     Basic metabolic panel; Future -     Empagliflozin; Take 1 tablet (10 mg total) by mouth daily before breakfast.  Dispense: 90 tablet; Refill: 1  Benign prostatic hyperplasia without lower urinary tract symptoms -     PSA; Future -     Urinalysis, Routine w reflex microscopic; Future     Follow-up: Return in about 6 months (around 11/28/2023).  Sanda Linger, MD

## 2023-05-31 NOTE — Patient Instructions (Signed)
 Hypertension, Adult High blood pressure (hypertension) is when the force of blood pumping through the arteries is too strong. The arteries are the blood vessels that carry blood from the heart throughout the body. Hypertension forces the heart to work harder to pump blood and may cause arteries to become narrow or stiff. Untreated or uncontrolled hypertension can lead to a heart attack, heart failure, a stroke, kidney disease, and other problems. A blood pressure reading consists of a higher number over a lower number. Ideally, your blood pressure should be below 120/80. The first ("top") number is called the systolic pressure. It is a measure of the pressure in your arteries as your heart beats. The second ("bottom") number is called the diastolic pressure. It is a measure of the pressure in your arteries as the heart relaxes. What are the causes? The exact cause of this condition is not known. There are some conditions that result in high blood pressure. What increases the risk? Certain factors may make you more likely to develop high blood pressure. Some of these risk factors are under your control, including: Smoking. Not getting enough exercise or physical activity. Being overweight. Having too much fat, sugar, calories, or salt (sodium) in your diet. Drinking too much alcohol. Other risk factors include: Having a personal history of heart disease, diabetes, high cholesterol, or kidney disease. Stress. Having a family history of high blood pressure and high cholesterol. Having obstructive sleep apnea. Age. The risk increases with age. What are the signs or symptoms? High blood pressure may not cause symptoms. Very high blood pressure (hypertensive crisis) may cause: Headache. Fast or irregular heartbeats (palpitations). Shortness of breath. Nosebleed. Nausea and vomiting. Vision changes. Severe chest pain, dizziness, and seizures. How is this diagnosed? This condition is diagnosed by  measuring your blood pressure while you are seated, with your arm resting on a flat surface, your legs uncrossed, and your feet flat on the floor. The cuff of the blood pressure monitor will be placed directly against the skin of your upper arm at the level of your heart. Blood pressure should be measured at least twice using the same arm. Certain conditions can cause a difference in blood pressure between your right and left arms. If you have a high blood pressure reading during one visit or you have normal blood pressure with other risk factors, you may be asked to: Return on a different day to have your blood pressure checked again. Monitor your blood pressure at home for 1 week or longer. If you are diagnosed with hypertension, you may have other blood or imaging tests to help your health care provider understand your overall risk for other conditions. How is this treated? This condition is treated by making healthy lifestyle changes, such as eating healthy foods, exercising more, and reducing your alcohol intake. You may be referred for counseling on a healthy diet and physical activity. Your health care provider may prescribe medicine if lifestyle changes are not enough to get your blood pressure under control and if: Your systolic blood pressure is above 130. Your diastolic blood pressure is above 80. Your personal target blood pressure may vary depending on your medical conditions, your age, and other factors. Follow these instructions at home: Eating and drinking  Eat a diet that is high in fiber and potassium, and low in sodium, added sugar, and fat. An example of this eating plan is called the DASH diet. DASH stands for Dietary Approaches to Stop Hypertension. To eat this way: Eat  plenty of fresh fruits and vegetables. Try to fill one half of your plate at each meal with fruits and vegetables. Eat whole grains, such as whole-wheat pasta, brown rice, or whole-grain bread. Fill about one  fourth of your plate with whole grains. Eat or drink low-fat dairy products, such as skim milk or low-fat yogurt. Avoid fatty cuts of meat, processed or cured meats, and poultry with skin. Fill about one fourth of your plate with lean proteins, such as fish, chicken without skin, beans, eggs, or tofu. Avoid pre-made and processed foods. These tend to be higher in sodium, added sugar, and fat. Reduce your daily sodium intake. Many people with hypertension should eat less than 1,500 mg of sodium a day. Do not drink alcohol if: Your health care provider tells you not to drink. You are pregnant, may be pregnant, or are planning to become pregnant. If you drink alcohol: Limit how much you have to: 0-1 drink a day for women. 0-2 drinks a day for men. Know how much alcohol is in your drink. In the [place] one drink equals one 12 oz bottle of beer (355 mL), one 5 oz glass of wine (148 mL), or one 1 oz glass of hard liquor (44 mL). Lifestyle  Work with your health care provider to maintain a healthy body weight or to lose weight. Ask what an ideal weight is for you. Get at least 30 minutes of exercise that causes your heart to beat faster (aerobic exercise) most days of the week. Activities may include walking, swimming, or biking. Include exercise to strengthen your muscles (resistance exercise), such as Pilates or lifting weights, as part of your weekly exercise routine. Try to do these types of exercises for 30 minutes at least 3 days a week. Do not use any products that contain nicotine or tobacco. These products include cigarettes, chewing tobacco, and vaping devices, such as e-cigarettes. If you need help quitting, ask your health care provider. Monitor your blood pressure at home as told by your health care provider. Keep all follow-up visits. This is important. Medicines Take over-the-counter and prescription medicines only as told by your health care provider. Follow directions carefully. Blood  pressure medicines must be taken as prescribed. Do not skip doses of blood pressure medicine. Doing this puts you at risk for problems and can make the medicine less effective. Ask your health care provider about side effects or reactions to medicines that you should watch for. Contact a health care provider if you: Think you are having a reaction to a medicine you are taking. Have headaches that keep coming back (recurring). Feel dizzy. Have swelling in your ankles. Have trouble with your vision. Get help right away if you: Develop a severe headache or confusion. Have unusual weakness or numbness. Feel faint. Have severe pain in your chest or abdomen. Vomit repeatedly. Have trouble breathing. These symptoms may be an emergency. Get help right away. Call 911. Do not wait to see if the symptoms will go away. Do not drive yourself to the hospital. Summary Hypertension is when the force of blood pumping through your arteries is too strong. If this condition is not controlled, it may put you at risk for serious complications. Your personal target blood pressure may vary depending on your medical conditions, your age, and other factors. For most people, a normal blood pressure is less than 120/80. Hypertension is treated with lifestyle changes, medicines, or a combination of both. Lifestyle changes include losing weight, eating a healthy,  low-sodium diet, exercising more, and limiting alcohol. This information is not intended to replace advice given to you by your health care provider. Make sure you discuss any questions you have with your health care provider. Document Revised: 01/26/2021 Document Reviewed: 01/26/2021 Elsevier Patient Education  2024 [employer].

## 2023-06-01 ENCOUNTER — Encounter: Payer: Self-pay | Admitting: Internal Medicine

## 2023-06-01 LAB — URINALYSIS, ROUTINE W REFLEX MICROSCOPIC
Bilirubin Urine: NEGATIVE
Hgb urine dipstick: NEGATIVE
Ketones, ur: NEGATIVE
Leukocytes,Ua: NEGATIVE
Nitrite: NEGATIVE
RBC / HPF: NONE SEEN (ref 0–?)
Specific Gravity, Urine: 1.01 (ref 1.000–1.030)
Total Protein, Urine: NEGATIVE
Urine Glucose: NEGATIVE
Urobilinogen, UA: 0.2 (ref 0.0–1.0)
pH: 6 (ref 5.0–8.0)

## 2023-06-01 MED ORDER — EMPAGLIFLOZIN 10 MG PO TABS: 10.0000 mg | ORAL_TABLET | Freq: Every day | ORAL | 1 refills | Status: DC

## 2023-06-01 MED ORDER — ROSUVASTATIN CALCIUM 10 MG PO TABS: 10.0000 mg | ORAL_TABLET | Freq: Every day | ORAL | 1 refills | Status: DC

## 2023-06-04 ENCOUNTER — Other Ambulatory Visit: Payer: Self-pay | Admitting: Internal Medicine

## 2023-06-04 DIAGNOSIS — I10 Essential (primary) hypertension: Secondary | ICD-10-CM

## 2023-06-09 ENCOUNTER — Ambulatory Visit: Payer: Self-pay | Admitting: Internal Medicine

## 2023-06-09 ENCOUNTER — Other Ambulatory Visit: Payer: Self-pay | Admitting: Internal Medicine

## 2023-06-09 ENCOUNTER — Telehealth: Payer: Self-pay | Admitting: Internal Medicine

## 2023-06-09 DIAGNOSIS — N1831 Chronic kidney disease, stage 3a: Secondary | ICD-10-CM

## 2023-06-09 MED ORDER — DAPAGLIFLOZIN PROPANEDIOL 10 MG PO TABS: 10.0000 mg | ORAL_TABLET | Freq: Every day | ORAL | 1 refills | Status: DC

## 2023-06-09 NOTE — Telephone Encounter (Signed)
  Chief Complaint: PCP Call  Additional Notes: Patient rec'd letter stating that his kidney function is declining and that he is to stop taking Candesartan and start taking Jardiance. Pt has several questions about what are the next steps for his kidneys. Also reports that the Jardiance is $700, requesting generic. Pt advised that someone from theoffice will call him back to further advise .   Reason for Disposition  [1] Follow-up call from patient regarding patient's clinical status AND [2] information NON-URGENT  Answer Assessment - Initial Assessment Questions 1. REASON FOR CALL or QUESTION: "What is your reason for calling today?" or "How can I best help you?" or "What question do you have that I can help answer?"     Lab results and medication change  2. CALLER: Document the source of call. (e.g., laboratory, patient).     Patient  Protocols used: PCP Call - No Triage-A-AH

## 2023-06-09 NOTE — Telephone Encounter (Signed)
 Copied from CRM [id number]. Topic: Clinical - Medication Question >> Jun 09, 2023  1:21 PM [place] J wrote: Reason for CRM: Patient was wondering why he has to take the prescribed empagliflozin (JARDIANCE) 10 MG. I let patient know the indication of use and he's confused on what his diagnosis is on kidneys.

## 2023-06-09 NOTE — Telephone Encounter (Signed)
**Note De-identified  [place] Obfuscation** Please advise 

## 2023-06-09 NOTE — Telephone Encounter (Signed)
 No answer, left message to return call to office to speak with triage nurse.   Copied from CRM [id number]. Topic: Clinical - Prescription Issue >> Jun 09, 2023  1:25 PM [place] J wrote: Reason for CRM: Patient is very confused with diagnosis and medications to take based on after visit summary. He would like to speak with Dr. Yetta Barre' Nurse/MA regarding this.

## 2023-06-09 NOTE — Telephone Encounter (Signed)
 Please see note below, patient is requesting generic Jardiance.   Copied from CRM [id number]. Topic: Clinical - Prescription Issue >> Jun 09, 2023  1:15 PM [place] J wrote: Reason for CRM: Patient's medication (empagliflozin (JARDIANCE) 10 MG TABS) is over $700. Patient would like a generic brand if possible.

## 2023-06-09 NOTE — Telephone Encounter (Signed)
 I have gone over all of the patients medications with him and he gave a verbal understanding.

## 2023-06-12 ENCOUNTER — Ambulatory Visit: Payer: Self-pay | Admitting: Internal Medicine

## 2023-06-12 ENCOUNTER — Telehealth: Payer: Self-pay

## 2023-06-12 NOTE — Telephone Encounter (Unsigned)
 Copied from CRM [id number]. Topic: Clinical - Prescription Issue >> Jun 12, 2023  8:44 AM Kathryne Eriksson wrote: Reason for CRM: dapagliflozin propanediol (FARXIGA) 10 MG TABS tablet >> Jun 12, 2023  8:46 AM Kathryne Eriksson wrote: Patient states he NEVER received his medication dapagliflozin propanediol (FARXIGA) 10 MG TABS tablet. It looks at though the medication was sent to [facility], when it was actually supposed to be sent to the [facility] - [address]. Patient is requesting to have this medication resent to the correct pharmacy.

## 2023-06-12 NOTE — Telephone Encounter (Signed)
  Chief Complaint: medication Disposition: [] ED /[] Urgent Care (no appt availability in office) / [] Appointment(In office/virtual)/ []  [place] [facility]/ [] Home Care/ [] Refused Recommended Disposition /[] [facility]/ [x]  Follow-up with PCP Additional Notes: Spoke with patient, patient was stating that he was told his [place] sent to [facility]. Patient states he has never gotten any medications filled at [facility].  He ould like this medication sent. To [facility]. Updated preferred pharmacy list.   Copied from CRM [id number]. Topic: Clinical - Medication Question >> Jun 09, 2023  1:21 PM [place] J wrote: Reason for CRM: Patient was wondering why he has to take the prescribed empagliflozin (JARDIANCE) 10 MG. I let patient know the indication of use and he's confused on what his diagnosis is on kidneys. >> Jun 12, 2023 12:45 PM [place] J wrote: Patient would like an update on this medication. He's worried about kidney function. Reason for Disposition . [1] Caller has NON-URGENT medicine question about med that PCP prescribed AND [2] triager unable to answer question  Answer Assessment - Initial Assessment Questions 1. NAME of MEDICINE: "What medicine(s) are you calling about?"     farxiga 2. QUESTION: "What is your question?" (e.g., double dose of medicine, side effect)     Medication not sent to [facility] 3. PRESCRIBER: "Who prescribed the medicine?" Reason: if prescribed by specialist, call should be referred to that group.     Dr. Sanda Linger  Protocols used: Medication Question Call-A-AH

## 2023-06-14 ENCOUNTER — Other Ambulatory Visit: Payer: Self-pay

## 2023-06-14 DIAGNOSIS — N1831 Chronic kidney disease, stage 3a: Secondary | ICD-10-CM

## 2023-06-14 MED ORDER — DAPAGLIFLOZIN PROPANEDIOL 10 MG PO TABS: 10.0000 mg | ORAL_TABLET | Freq: Every day | ORAL | 1 refills | Status: DC

## 2023-06-14 NOTE — Telephone Encounter (Signed)
 Medication has been changed. When I called the patient this morning he didn't have any question. This has been handled in another telephone call.

## 2023-06-14 NOTE — Telephone Encounter (Signed)
 Medication has been sent. Handled in another encounter.

## 2023-06-14 NOTE — Telephone Encounter (Signed)
Medication sent.  Patient aware  

## 2023-06-14 NOTE — Telephone Encounter (Signed)
 Copied from CRM [id number]. Topic: Clinical - Prescription Issue >> Jun 14, 2023  2:32 PM Pascal Lux wrote: Reason for CRM: Patient is requesting a call regarding his medication: dapagliflozin propanediol (FARXIGA) 10 MG TABS tablet [[id number]]. Stated he went to go pick up his medication and it is 500 and is unable to afford that.

## 2023-06-15 ENCOUNTER — Other Ambulatory Visit: Payer: Self-pay | Admitting: Internal Medicine

## 2023-06-18 ENCOUNTER — Other Ambulatory Visit: Payer: Self-pay | Admitting: Internal Medicine

## 2023-06-18 DIAGNOSIS — E785 Hyperlipidemia, unspecified: Secondary | ICD-10-CM

## 2023-06-22 ENCOUNTER — Other Ambulatory Visit: Payer: Self-pay | Admitting: Internal Medicine

## 2023-06-22 DIAGNOSIS — E785 Hyperlipidemia, unspecified: Secondary | ICD-10-CM

## 2023-06-22 NOTE — Telephone Encounter (Signed)
 Copied from CRM [id number]. Topic: Clinical - Medication Refill >> Jun 22, 2023 11:26 AM Almira Coaster wrote: Most Recent Primary Care Visit:  Provider: Etta Grandchild  Department: [facility]  Visit Type: OFFICE VISIT  Date: 05/31/2023  Medication: rosuvastatin (CRESTOR) 10 MG tablet  Has the patient contacted their pharmacy? Yes, pharmacy informed patient that there were no refills left.  (Agent: If no, request that the patient contact the pharmacy for the refill. If patient does not wish to contact the pharmacy document the reason why and proceed with request.) (Agent: If yes, when and what did the pharmacy advise?)  Is this the correct pharmacy for this prescription? Yes If no, delete pharmacy and type the correct one.  This is the patient's preferred pharmacy:  [id number] G[place] Otto, [place] - [address] AT [facility] [address] [facility] [address] [place] [address] Phone: [phone] Fax: [phone]   Has the prescription been filled recently? Yes  Is the patient out of the medication? Yes  Has the patient been seen for an appointment in the last year OR does the patient have an upcoming appointment? Yes  Can we respond through MyChart? No  Agent: Please be advised that Rx refills may take up to 3 business days. We ask that you follow-up with your pharmacy.

## 2023-06-23 ENCOUNTER — Other Ambulatory Visit: Payer: Medicare Other

## 2023-06-23 MED ORDER — ROSUVASTATIN CALCIUM 10 MG PO TABS: 10.0000 mg | ORAL_TABLET | Freq: Every day | ORAL | 1 refills | Status: DC

## 2023-07-06 ENCOUNTER — Other Ambulatory Visit: Payer: Self-pay | Admitting: Internal Medicine

## 2023-07-14 ENCOUNTER — Ambulatory Visit
Admission: RE | Admit: 2023-07-14 | Discharge: 2023-07-14 | Disposition: A | Discharge status: Home / Self Care | Source: Ambulatory Visit | Attending: Internal Medicine | Admitting: Internal Medicine

## 2023-07-14 DIAGNOSIS — E785 Hyperlipidemia, unspecified: Secondary | ICD-10-CM

## 2023-07-16 ENCOUNTER — Other Ambulatory Visit: Payer: Self-pay | Admitting: Internal Medicine

## 2023-07-16 ENCOUNTER — Encounter: Payer: Self-pay | Admitting: Internal Medicine

## 2023-07-16 DIAGNOSIS — R931 Abnormal findings on diagnostic imaging of heart and coronary circulation: Secondary | ICD-10-CM | POA: Insufficient documentation

## 2023-07-24 ENCOUNTER — Other Ambulatory Visit: Payer: Self-pay | Admitting: Internal Medicine

## 2023-07-24 DIAGNOSIS — I10 Essential (primary) hypertension: Secondary | ICD-10-CM

## 2023-08-15 ENCOUNTER — Other Ambulatory Visit: Payer: Self-pay

## 2023-09-07 ENCOUNTER — Encounter (HOSPITAL_BASED_OUTPATIENT_CLINIC_OR_DEPARTMENT_OTHER): Payer: Self-pay

## 2023-09-10 ENCOUNTER — Telehealth: Payer: Self-pay | Admitting: Internal Medicine

## 2023-09-10 DIAGNOSIS — I10 Essential (primary) hypertension: Secondary | ICD-10-CM

## 2023-09-13 NOTE — Telephone Encounter (Signed)
 Copied from CRM [id number]. Topic: Clinical - Prescription Issue >> Sep 13, 2023 11:55 AM Albertha Alosa wrote: Reason for CRM: Patient called in stated he received an notification stating the prescription wasn't approved by the provider for his medication  candesartan  (ATACAND ) 16 MG tablet  ---  Appears RX was sent on 5.13.25

## 2023-09-14 NOTE — Telephone Encounter (Signed)
 Patient has been made aware that his medication is ready for pickup at his local pharmacy. He gave a verbal understanding.

## 2023-10-03 ENCOUNTER — Telehealth: Payer: Self-pay | Admitting: Internal Medicine

## 2023-10-03 NOTE — Telephone Encounter (Signed)
 Copied from CRM [id number]. Topic: Clinical - Prescription Issue >> Oct 03, 2023 10:25 AM [name] Lambert wrote: Reason for CRM: Patient's dapagliflozin  propanediol (FARXIGA ) 10 MG TABS tablet is over $300 and he was wondering if there is a way to only prescribe a 30-day supply since a 90-day supply is over $300 and it's hard for him to pay that all upfront.

## 2023-10-05 ENCOUNTER — Other Ambulatory Visit: Payer: Self-pay

## 2023-10-05 MED ORDER — DAPAGLIFLOZIN PROPANEDIOL 10 MG PO TABS: 10.0000 mg | ORAL_TABLET | Freq: Every day | ORAL | 0 refills | Status: DC

## 2023-10-05 NOTE — Telephone Encounter (Signed)
 30 day supply has been sent in. I advise the patient if the medication is still too high to give me a call back so I can get him in contact with the pharmacist to possible get him some patient assistance and I advised him that we have samples in the office if he needs them. He gave a verbal understanding.

## 2023-10-09 ENCOUNTER — Ambulatory Visit (INDEPENDENT_AMBULATORY_CARE_PROVIDER_SITE_OTHER): Payer: Medicare Other

## 2023-10-09 VITALS — Ht 71.0 in | Wt 172.0 lb

## 2023-10-09 DIAGNOSIS — Z0000 Encounter for general adult medical examination without abnormal findings: Secondary | ICD-10-CM | POA: Diagnosis not present
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
## 2023-10-09 NOTE — Patient Instructions (Addendum)
 Mr. Fretwell , Thank you for taking time out of your busy schedule to complete your Annual Wellness Visit with me. I enjoyed our conversation and look forward to speaking with you again next year. I, as well as your care team,  appreciate your ongoing commitment to your health goals. Please review the following plan we discussed and let me know if I can assist you in the future. Your Game plan/ To Do List    Follow up Visits: Next Medicare AWV with our clinical staff: 10/09/2024   Have you seen your provider in the last 6 months (3 months if uncontrolled diabetes)? Yes Next Office Visit with your provider: to be scheduled by the patient  Clinician Recommendations:  Aim for 30 minutes of exercise or brisk walking, 6-8 glasses of water, and 5 servings of fruits and vegetables each day. Educated and advised on getting the Shingles vaccines in 2025.      This is a list of the screening recommended for you and due dates:  Health Maintenance  Topic Date Due   Zoster (Shingles) Vaccine (1 of 2) Never done   Flu Shot  11/03/2023   Cologuard (Stool DNA test)  12/26/2023   Medicare Annual Wellness Visit  10/08/2024   DTaP/Tdap/Td vaccine (2 - Td or Tdap) 07/07/2027   Pneumococcal Vaccine for age over 37  Completed   Hepatitis C Screening  Completed   Hepatitis B Vaccine  Aged Out   HPV Vaccine  Aged Out   Meningitis B Vaccine  Aged Out   COVID-19 Vaccine  Discontinued    Advanced directives: (Declined) Advance directive discussed with you today. Even though you declined this today, please call our office should you change your mind, and we can give you the proper paperwork for you to fill out. Advance Care Planning is important because it:  [x]  Makes sure you receive the medical care that is consistent with your values, goals, and preferences  [x]  It provides guidance to your family and loved ones and reduces their decisional burden about whether or not they are making the right decisions based  on your wishes.  Follow the link provided in your after visit summary or read over the paperwork we have mailed to you to help you started getting your Advance Directives in place. If you need assistance in completing these, please reach out to us  so that we can help you!

## 2023-11-30 ENCOUNTER — Other Ambulatory Visit: Payer: Self-pay | Admitting: Internal Medicine

## 2023-11-30 DIAGNOSIS — I10 Essential (primary) hypertension: Secondary | ICD-10-CM

## 2023-12-25 ENCOUNTER — Other Ambulatory Visit: Payer: Self-pay | Admitting: Internal Medicine

## 2023-12-25 DIAGNOSIS — E785 Hyperlipidemia, unspecified: Secondary | ICD-10-CM

## 2024-01-06 ENCOUNTER — Other Ambulatory Visit: Payer: Self-pay | Admitting: Internal Medicine

## 2024-01-08 ENCOUNTER — Other Ambulatory Visit: Payer: Self-pay | Admitting: Internal Medicine

## 2024-01-08 ENCOUNTER — Telehealth: Payer: Self-pay

## 2024-01-08 DIAGNOSIS — I10 Essential (primary) hypertension: Secondary | ICD-10-CM

## 2024-01-08 MED ORDER — DAPAGLIFLOZIN PROPANEDIOL 10 MG PO TABS: 10.0000 mg | ORAL_TABLET | Freq: Every day | ORAL | 0 refills | Status: DC

## 2024-01-08 MED ORDER — CANDESARTAN CILEXETIL 16 MG PO TABS: 16.0000 mg | ORAL_TABLET | Freq: Every day | ORAL | 0 refills | Status: DC

## 2024-01-08 NOTE — Telephone Encounter (Signed)
He is overdue for an appt.

## 2024-01-08 NOTE — Telephone Encounter (Signed)
 Copied from CRM #[id number]. Topic: Clinical - Medication Refill >> Jan 08, 2024 10:43 AM Berneda FALCON wrote: Medication:  candesartan  (ATACAND ) 16 MG tablet dapagliflozin  propanediol (FARXIGA ) 10 MG TABS tablet  Has the patient contacted their pharmacy? Yes (Agent: If no, request that the patient contact the pharmacy for the refill. If patient does not wish to contact the pharmacy document the reason why and proceed with request.) (Agent: If yes, when and what did the pharmacy advise?)  This is the patient's preferred pharmacy:  [id number] [address], [place] - [address] AT [facility] [address] & [facility] [address] [place] [address] Phone: [phone] Fax: [phone]  Is this the correct pharmacy for this prescription? Yes If no, delete pharmacy and type the correct one.   Has the prescription been filled recently? No  Is the patient out of the medication? No, but only has 3 left  Has the patient been seen for an appointment in the last year OR does the patient have an upcoming appointment? Yes  Can we respond through MyChart? No  Agent: Please be advised that Rx refills may take up to 3 business days. We ask that you follow-up with your pharmacy.

## 2024-01-08 NOTE — Telephone Encounter (Signed)
 Patient has been scheduled and a temp supply of medication has been sent in

## 2024-01-08 NOTE — Telephone Encounter (Signed)
**Note De-identified  [place] Obfuscation** Please advise 

## 2024-01-08 NOTE — Telephone Encounter (Signed)
 Copied from CRM #[id number]. Topic: Clinical - Medication Question >> Jan 08, 2024 10:42 AM Berneda FALCON wrote: Reason for CRM: Patient would like to know if he is going to stay on the current medications forever. He wants to know  if this is something he needs to refill or if this is something he is going to stop taking any of them at some point.

## 2024-01-15 ENCOUNTER — Other Ambulatory Visit: Payer: Self-pay | Admitting: Internal Medicine

## 2024-01-15 ENCOUNTER — Ambulatory Visit: Admitting: Internal Medicine

## 2024-01-15 DIAGNOSIS — I10 Essential (primary) hypertension: Secondary | ICD-10-CM

## 2024-01-15 NOTE — Telephone Encounter (Unsigned)
 Copied from CRM [id number]. Topic: Clinical - Medication Refill >> Jan 15, 2024  8:27 AM Kevelyn M wrote: Medication: candesartan  (ATACAND ) 16 MG tablet (Patient had to reschedule his appointment for Nov. 6th because that the soonest he had available. He will need 3.5 weeks worth of this pill.   dapagliflozin  propanediol (FARXIGA ) 10 MG TABS tablet-he needs 3.5 weeks.  Has the patient contacted their pharmacy? Yes (Agent: If no, request that the patient contact the pharmacy for the refill. If patient does not wish to contact the pharmacy document the reason why and proceed with request.) (Agent: If yes, when and what did the pharmacy advise?)  This is the patient's preferred pharmacy:  [id number] [address], [place] - [address] AT [facility] OF [address] & [facility] [address] [place] [address] Phone: [phone] Fax: [phone]  Is this the correct pharmacy for this prescription? Yes If no, delete pharmacy and type the correct one.   Has the prescription been filled recently? Yes  Is the patient out of the medication? Yes out of the dapagliflozin  propanediol (FARXIGA ) 10 MG TABS tablet  Has the patient been seen for an appointment in the last year OR does the patient have an upcoming appointment? Yes  Can we respond through MyChart? No  Agent: Please be advised that Rx refills may take up to 3 business days. We ask that you follow-up with your pharmacy.

## 2024-01-17 MED ORDER — CANDESARTAN CILEXETIL 16 MG PO TABS: 16.0000 mg | ORAL_TABLET | Freq: Every day | ORAL | 0 refills | Status: DC

## 2024-01-19 ENCOUNTER — Telehealth: Payer: Self-pay

## 2024-01-19 ENCOUNTER — Other Ambulatory Visit: Payer: Self-pay

## 2024-01-19 DIAGNOSIS — I10 Essential (primary) hypertension: Secondary | ICD-10-CM

## 2024-01-19 MED ORDER — CANDESARTAN CILEXETIL 16 MG PO TABS: 16.0000 mg | ORAL_TABLET | Freq: Every day | ORAL | 0 refills | Status: DC

## 2024-01-19 MED ORDER — DAPAGLIFLOZIN PROPANEDIOL 10 MG PO TABS: 10.0000 mg | ORAL_TABLET | Freq: Every day | ORAL | 0 refills | Status: DC

## 2024-01-19 NOTE — Telephone Encounter (Signed)
 Copied from CRM #[id number]. Topic: Clinical - Medication Question >> Jan 19, 2024 10:28 AM Harlene ORN wrote: Reason for CRM: Patient called about his Farxiga   candesartan  (ATACAND ) 16 MG tablet medication. Was given a 7 day supply, but won't last enough until his next appointment on 02/08/2024. Please call back the patient to discuss more refills.

## 2024-01-19 NOTE — Telephone Encounter (Signed)
 Medication temp supply has been corrected and sent in. Patient has been made aware.

## 2024-02-08 ENCOUNTER — Encounter: Payer: Self-pay | Admitting: Internal Medicine

## 2024-02-08 ENCOUNTER — Ambulatory Visit: Admitting: Internal Medicine

## 2024-02-08 VITALS — BP 130/80 | HR 77 | Temp 97.8°F | Resp 16 | Ht 71.0 in | Wt 176.0 lb

## 2024-02-08 DIAGNOSIS — E785 Hyperlipidemia, unspecified: Secondary | ICD-10-CM | POA: Diagnosis not present

## 2024-02-08 DIAGNOSIS — I10 Essential (primary) hypertension: Secondary | ICD-10-CM

## 2024-02-08 DIAGNOSIS — Z1211 Encounter for screening for malignant neoplasm of colon: Secondary | ICD-10-CM | POA: Insufficient documentation

## 2024-02-08 DIAGNOSIS — N400 Enlarged prostate without lower urinary tract symptoms: Secondary | ICD-10-CM

## 2024-02-08 DIAGNOSIS — N1831 Chronic kidney disease, stage 3a: Secondary | ICD-10-CM | POA: Diagnosis not present

## 2024-02-08 DIAGNOSIS — Z23 Encounter for immunization: Secondary | ICD-10-CM | POA: Insufficient documentation

## 2024-02-08 LAB — BASIC METABOLIC PANEL WITH GFR
BUN: 23 mg/dL (ref 6–23)
CO2: 30 meq/L (ref 19–32)
Calcium: 9.4 mg/dL (ref 8.4–10.5)
Chloride: 106 meq/L (ref 96–112)
Creatinine, Ser: 1.77 mg/dL — ABNORMAL HIGH (ref 0.40–1.50)
GFR: 38.54 mL/min — ABNORMAL LOW (ref >60.00)
Glucose, Bld: 85 mg/dL (ref 70–99)
Potassium: 4.7 meq/L (ref 3.5–5.1)
Sodium: 142 meq/L (ref 135–145)

## 2024-02-08 LAB — CBC WITH DIFFERENTIAL/PLATELET
Basophils Absolute: 0 K/uL (ref 0.0–0.1)
Basophils Relative: 0.8 % (ref 0.0–3.0)
Eosinophils Absolute: 0.5 K/uL (ref 0.0–0.7)
Eosinophils Relative: 12.4 % — ABNORMAL HIGH (ref 0.0–5.0)
HCT: 44.6 % (ref 39.0–52.0)
Hemoglobin: 15 g/dL (ref 13.0–17.0)
Lymphocytes Relative: 18.6 % (ref 12.0–46.0)
Lymphs Abs: 0.8 K/uL (ref 0.7–4.0)
MCHC: 33.8 g/dL (ref 30.0–36.0)
MCV: 93.1 fl (ref 78.0–100.0)
Monocytes Absolute: 0.4 K/uL (ref 0.1–1.0)
Monocytes Relative: 10.6 % (ref 3.0–12.0)
Neutro Abs: 2.4 K/uL (ref 1.4–7.7)
Neutrophils Relative %: 57.6 % (ref 43.0–77.0)
Platelets: 177 K/uL (ref 150.0–400.0)
RBC: 4.79 Mil/uL (ref 4.22–5.81)
RDW: 13.3 % (ref 11.5–15.5)
WBC: 4.1 K/uL (ref 4.0–10.5)

## 2024-02-08 LAB — PSA: PSA: 2.03 ng/mL (ref 0.10–4.00)

## 2024-02-08 MED ORDER — ROSUVASTATIN CALCIUM 10 MG PO TABS: ORAL_TABLET | ORAL | 0 refills | Status: DC

## 2024-02-08 MED ORDER — EMPAGLIFLOZIN 10 MG PO TABS: 10.0000 mg | ORAL_TABLET | Freq: Every day | ORAL | 1 refills | Status: DC

## 2024-02-08 MED ORDER — CANDESARTAN CILEXETIL 16 MG PO TABS: 16.0000 mg | ORAL_TABLET | Freq: Every day | ORAL | 1 refills | Status: DC

## 2024-02-08 MED ORDER — SHINGRIX 50 MCG/0.5ML IM SUSR: 0.5000 mL | Freq: Once | INTRAMUSCULAR | 1 refills | Status: AC

## 2024-02-08 NOTE — Progress Notes (Signed)
 Subjective:  Patient ID: [name] Lambert, male    DOB: 01/20/1956  Age: 68 y.o. MRN: [id number]  CC: Hypertension and Hyperlipidemia   HPI [name] Lambert presents for f/up ----  Discussed the use of AI scribe software for clinical note transcription with the patient, who gave verbal consent to proceed.  History of Present Illness [name] Lambert is a 68 year old male who presents for routine follow-up of his kidney condition.  He maintains an active lifestyle, including walking his dog daily and doing yard work. No symptoms of high blood pressure, dizziness, or lightheadedness. He reports no symptoms related to low blood pressure.  He is currently taking three medications, including Farxiga  for his kidney condition. No kidney-related symptoms such as abdominal pain, nausea, or vomiting. He prefers to avoid providing a urine specimen today.  He has not received a flu vaccine this year and is considering getting vaccinated against shingles in the future.     Outpatient Medications Prior to Visit  Medication Sig Dispense Refill   candesartan  (ATACAND ) 16 MG tablet Take 1 tablet (16 mg total) by mouth daily. 7 tablet 0   dapagliflozin  propanediol (FARXIGA ) 10 MG TABS tablet Take 1 tablet (10 mg total) by mouth daily. 20 tablet 0   rosuvastatin  (CRESTOR ) 10 MG tablet TAKE 1 TABLET(10 MG) BY MOUTH DAILY. Please schedule appointment for refills. 90 tablet 0   No facility-administered medications prior to visit.    ROS Review of Systems  Objective:  BP 130/80 (BP Location: Left Arm, Patient Position: Sitting, Cuff Size: Normal)   Pulse 77   Temp 97.8 F (36.6 C) (Oral)   Resp 16   Ht 5' 11 (1.803 m)   Wt 176 lb (79.8 kg)   SpO2 97%   BMI 24.55 kg/m   BP Readings from Last 3 Encounters:  02/08/24 130/80  05/31/23 128/78  01/06/22 136/86    Wt Readings from Last 3 Encounters:  02/08/24 176 lb (79.8 kg)  10/09/23 172 lb (78 kg)  05/31/23 172 lb 3.2 oz (78.1 kg)     Physical Exam  Lab Results  Component Value Date   WBC 4.1 02/08/2024   HGB 15.0 02/08/2024   HCT 44.6 02/08/2024   PLT 177.0 02/08/2024   GLUCOSE 85 02/08/2024   CHOL 128 05/31/2023   TRIG 121.0 05/31/2023   HDL 48.50 05/31/2023   LDLDIRECT 140.0 07/06/2017   LDLCALC 55 05/31/2023   ALT 20 05/31/2023   AST 19 05/31/2023   NA 142 02/08/2024   K 4.7 02/08/2024   CL 106 02/08/2024   CREATININE 1.77 (H) 02/08/2024   BUN 23 02/08/2024   CO2 30 02/08/2024   TSH 2.03 05/31/2023   PSA 2.03 02/08/2024    CT CARDIAC SCORING ([facility] LOCATIONS ONLY) Result Date: 07/15/2023 CLINICAL DATA:  Hyperlipidemia * Tracking Code: FCC * EXAM: CT CARDIAC CORONARY ARTERY CALCIUM  SCORE TECHNIQUE: Non-contrast imaging through the heart was performed using prospective ECG gating. Image post processing was performed on an independent workstation, allowing for quantitative analysis of the heart and coronary arteries. Note that this exam targets the heart and the chest was not imaged in its entirety. COMPARISON:  02/28/2011 FINDINGS: CORONARY CALCIUM  SCORES: Left Main: 10.2 LAD: 394 LCx: 16.5 RCA/PDA: 0 Total Agatston Score: 420 MESA database percentile: 71 AORTA MEASUREMENTS: Ascending Aorta: 3.8 cm Descending Aorta:2.5 cm OTHER FINDINGS: Heart is normal size. Aorta normal caliber. Moderate calcifications in the aortic root and descending thoracic aorta. No adenopathy. No  confluent opacities or effusions. No acute findings in the upper abdomen. Chest wall soft tissues are unremarkable. No acute bony abnormality. IMPRESSION: Total Agatston score: 71 [place] database percentile: 3.8 Aortic atherosclerosis. No acute extra cardiac abnormality. Electronically Signed   By: Franky Crease M.D.   On: 07/15/2023 21:12    Assessment & Plan:  Primary hypertension -     Urinalysis, Routine w reflex microscopic; Future -     Basic metabolic panel with GFR; Future -     CBC with Differential/Platelet; Future -     Urinalysis,  Routine w reflex microscopic; Future -     Candesartan  Cilexetil; Take 1 tablet (16 mg total) by mouth daily.  Dispense: 90 tablet; Refill: 1  Stage 3a chronic kidney disease (HCC) -     Urinalysis, Routine w reflex microscopic; Future -     Basic metabolic panel with GFR; Future -     Urinalysis, Routine w reflex microscopic; Future -     Empagliflozin ; Take 1 tablet (10 mg total) by mouth daily.  Dispense: 90 tablet; Refill: 1  Benign prostatic hyperplasia without lower urinary tract symptoms -     PSA; Future -     Urinalysis, Routine w reflex microscopic; Future -     Urinalysis, Routine w reflex microscopic; Future  Screening for colon cancer -     Cologuard  Need for prophylactic vaccination and inoculation against varicella -     Shingrix ; Inject 0.5 mLs into the muscle once for 1 dose.  Dispense: 0.5 mL; Refill: 1  Hyperlipidemia with target LDL less than 130 -     Rosuvastatin  Calcium ; TAKE 1 TABLET(10 MG) BY MOUTH DAILY. Please schedule appointment for refills.  Dispense: 90 tablet; Refill: 0     Follow-up: Return in about 6 months (around 08/07/2024).  Debby Molt, MD

## 2024-02-08 NOTE — Patient Instructions (Signed)
 Chronic Kidney Disease in Adults: What to Know Chronic kidney disease (CKD) is when lasting damage happens to the kidneys slowly over time. The kidneys are two organs that do many important things in the body. These include: Taking waste and extra fluid out of the blood to make pee (urine). Making hormones. Keeping the right amount of fluids and chemicals in the body. A small amount of kidney damage may not cause problems. You must take steps to help keep the kidney damage from getting worse. A lot of damage may cause kidney failure. Kidney failure means the kidneys can no longer work right. What are the causes? Diabetes. High blood pressure. Diseases that affect the heart and blood vessels. Other kidney diseases. Diseases that affect the body's defense system (immune system). A problem with the flow of pee. This may be caused by: Kidney stones. Cancer. An enlarged prostate, in males. A kidney infection or urinary tract infection (UTI) that keeps coming back. What increases the risk? Getting older. The chances of having CKD increase with age. A family history of kidney disease or kidney failure. Having a disease caused by genes. Taking medicines that can harm the kidneys. Being near or having contact with harmful substances. Being very overweight. Using tobacco now or in the past. What are the signs or symptoms? Common symptoms of CKD include: Feeling very tired and having less energy. Swelling of the face, legs, ankles, or feet. Throwing up or feeling like you may throw up. Not wanting to eat as much as normal. Being confused or not able to focus. Twitches and cramps in the leg muscles or other muscles. Dry, itchy skin. Other symptoms may include: Shortness of breath. Trouble sleeping. Making less pee, or making more pee, especially at night. A taste of metal in your mouth. You may also become anemic. Anemia means there's not enough red blood cells in your blood. You may get  symptoms slowly. You may not notice them until the kidney damage gets very bad. How is this diagnosed? CKD may be diagnosed based on: Tests on your blood or pee. Imaging tests, like an ultrasound or a CT scan. A kidney biopsy. For this test, a sample of kidney tissue is removed to be looked at under a microscope. These tests will help to find out how serious the CKD is. How is this treated? Often, there's no cure for CKD. Treatment can help with symptoms and help keep the disease from getting worse. Treatment may include: Treating other problems that are causing your CKD or making it worse. Diet changes. You may need to: Avoid alcohol. Avoid foods that are high in salt, potassium, phosphorous, and protein. Taking medicines for symptoms and to help control other conditions. Dialysis. This treatment gets harmful waste out of your body. It may be needed if you have kidney failure. Follow these instructions at home: Medicines Take your medicines only as told. The amount of some medicines you take may need to be changed. Do not take any new medicines, vitamins, or supplements unless your health care provider says it's okay. These may make kidney damage worse. Lifestyle Do not smoke, vape, or use nicotine or tobacco. If you drink alcohol: Limit how much you have to: 0-1 drink a day if you're male. 0-2 drinks a day if you're male. Know how much alcohol is in your drink. In the [place] one drink is one 12 oz bottle of beer (355 mL), one 5 oz glass of wine (148 mL), or one 1 oz  glass of hard liquor (44 mL). Stay at a healthy weight. If you need help, ask your provider. General instructions  Eat and drink as told. Track your blood pressure at home. Tell your provider about any changes. If you have diabetes, track your blood sugar as told. Exercise at least 30 minutes a day, 5 days a week. Keep your shots (vaccinations) up to date. Keep all follow-up visits. Your provider may need to change  your treatments over time. Where to find support American Kidney Fund: [place].com.au Kidney School: kidneyschool.org American Association of Kidney Patients: https://www.miller-montoya.com/ Where to find more information National Kidney Foundation: kidney.org Centers for Disease Control and Prevention. To learn more: Go to DiningCalendar.de. Click "Search". Type "chronic kidney disease" in the search box. Contact a health care provider if: You have new symptoms. You get symptoms of end-stage kidney disease. These include: Headaches. Numbness in your hands or feet. Leg cramps. Easy bruising. Get help right away if: You have a fever. You make less pee than usual. You have pain or bleeding when you pee or poop. You have chest pain. You have shortness of breath. These symptoms may be an emergency. Call 911 right away. Do not wait to see if the symptoms will go away. Do not drive yourself to the hospital. This information is not intended to replace advice given to you by your health care provider. Make sure you discuss any questions you have with your health care provider. Document Revised: 01/31/2023 Document Reviewed: 09/23/2022 Elsevier Patient Education  2024 [employer].

## 2024-02-09 ENCOUNTER — Ambulatory Visit: Payer: Self-pay | Admitting: Internal Medicine

## 2024-02-13 ENCOUNTER — Telehealth: Payer: Self-pay

## 2024-02-13 ENCOUNTER — Other Ambulatory Visit: Payer: Self-pay

## 2024-02-13 DIAGNOSIS — N1831 Chronic kidney disease, stage 3a: Secondary | ICD-10-CM

## 2024-02-13 MED ORDER — EMPAGLIFLOZIN 10 MG PO TABS: 10.0000 mg | ORAL_TABLET | Freq: Every day | ORAL | 5 refills | Status: DC

## 2024-02-13 NOTE — Telephone Encounter (Signed)
 Patient has been made aware. Asked if we can send in 30 day supplies instead on 90. Script has been changed, patient has been made aware. Verbal understanding has been given.

## 2024-02-13 NOTE — Telephone Encounter (Signed)
 Copied from CRM [id number]. Topic: Clinical - Prescription Issue >> Feb 13, 2024 10:43 AM Rea ORN wrote: Reason for CRM: Pt would like to know why PCP changed Farxiga  to Jardiance . Pt stated it cost over $300 out of pocket. He still has 2 Farxiga  tablets left.  Please call back [phone]

## 2024-02-13 NOTE — Telephone Encounter (Signed)
**Note De-identified  [place] Obfuscation** Please advise 

## 2024-03-18 ENCOUNTER — Other Ambulatory Visit: Payer: Self-pay | Admitting: Internal Medicine

## 2024-03-18 DIAGNOSIS — I10 Essential (primary) hypertension: Secondary | ICD-10-CM

## 2024-03-18 DIAGNOSIS — N1831 Chronic kidney disease, stage 3a: Secondary | ICD-10-CM

## 2024-03-18 MED ORDER — EMPAGLIFLOZIN 10 MG PO TABS: 10.0000 mg | ORAL_TABLET | Freq: Every day | ORAL | 5 refills | Status: AC

## 2024-03-18 MED ORDER — CANDESARTAN CILEXETIL 16 MG PO TABS: 16.0000 mg | ORAL_TABLET | Freq: Every day | ORAL | 1 refills | Status: AC

## 2024-03-18 NOTE — Telephone Encounter (Signed)
 Copied from CRM #[id number]. Topic: Clinical - Medication Refill >> Mar 18, 2024  8:27 AM Frederich PARAS wrote: Medication: empagliflozin  (JARDIANCE ) 10 MG TABS tablet, candesartan  (ATACAND ) 16 MG tablet  Has the patient contacted their pharmacy? No Pt says they usually tell jim to reach out to us .   This is the patient's preferred pharmacy:  [id number] [address], [place] - [address] AT [facility] OF [address] & [facility] [address] [place] [address] Phone: [phone] Fax: [phone]  Is this the correct pharmacy for this prescription? Yes If no, delete pharmacy and type the correct one.   Has the prescription been filled recently? Yes  Is the patient out of the medication? No, 1 days left of the medications   Has the patient been seen for an appointment in the last year OR does the patient have an upcoming appointment? Yes  Can we respond through MyChart? No  Agent: Please be advised that Rx refills may take up to 3 business days. We ask that you follow-up with your pharmacy.

## 2024-03-19 ENCOUNTER — Telehealth: Payer: Self-pay

## 2024-03-19 NOTE — Telephone Encounter (Signed)
 Copied from CRM #[id number]. Topic: General - Other >> Mar 19, 2024  9:46 AM Rosina BIRCH wrote: Reason for RMF:ejupzwu called wanting to know if the doctor knows any  ADHD doctors because he was diagnosed a long time go with ADHD and the doctor he had left town so he need a new one [phone]

## 2024-03-20 NOTE — Telephone Encounter (Signed)
 Patient has been scheduled for an appointment with Dr. Joshua to discuss this.

## 2024-03-25 ENCOUNTER — Ambulatory Visit: Admitting: Internal Medicine

## 2024-03-25 ENCOUNTER — Encounter: Payer: Self-pay | Admitting: Internal Medicine

## 2024-03-25 VITALS — BP 118/74 | HR 76 | Temp 98.1°F | Resp 16 | Ht 71.0 in | Wt 173.0 lb

## 2024-03-25 DIAGNOSIS — F900 Attention-deficit hyperactivity disorder, predominantly inattentive type: Secondary | ICD-10-CM | POA: Diagnosis not present

## 2024-03-25 DIAGNOSIS — I10 Essential (primary) hypertension: Secondary | ICD-10-CM

## 2024-03-25 MED ORDER — AMPHETAMINE-DEXTROAMPHET ER 20 MG PO CP24: 20.0000 mg | ORAL_CAPSULE | ORAL | 0 refills | Status: DC

## 2024-03-25 NOTE — Progress Notes (Signed)
 "  Subjective:  Patient ID: [name] Lambert, male    DOB: Jul 20, 1955  Age: 68 y.o. MRN: [id number]  CC: Medical Management of Chronic Issues (Discuss ADHD)   HPI KEELYN FJELSTAD presents for f/up ----  Discussed the use of AI scribe software for clinical note transcription with the patient, who gave verbal consent to proceed.  History of Present Illness [name] Lambert is a 68 year old male who presents for medication management of ADHD.  He was diagnosed with ADHD approximately ten years ago through a series of questions, though he does not recall specific details. He had been taking Adderall and tolerated it well.  He experiences ongoing symptoms of ADHD, including a fluctuating attention span and being easily distracted, particularly during conversations or while watching TV. He describes himself as 'jumping around' and 'moving around' when distracted by noises. Despite these symptoms, his productivity and ability to get through the day are not affected, and he has not experienced any impact on his academic or professional life.  No symptoms of anxiety, depression, or sleep disturbances. He does not take any medications that would interfere with his focus, such as Benadryl, antihistamines, THC, or alcohol.     Outpatient Medications Prior to Visit  Medication Sig Dispense Refill   candesartan  (ATACAND ) 16 MG tablet Take 1 tablet (16 mg total) by mouth daily. 90 tablet 1   empagliflozin  (JARDIANCE ) 10 MG TABS tablet Take 1 tablet (10 mg total) by mouth daily. 30 tablet 5   rosuvastatin  (CRESTOR ) 10 MG tablet TAKE 1 TABLET(10 MG) BY MOUTH DAILY. Please schedule appointment for refills. 90 tablet 0   No facility-administered medications prior to visit.    ROS Review of Systems  Constitutional:  Negative for appetite change, chills, diaphoresis, fatigue and fever.  HENT: Negative.    Eyes: Negative.   Respiratory: Negative.  Negative for cough, chest tightness, shortness of  breath and wheezing.   Cardiovascular:  Negative for chest pain, palpitations and leg swelling.  Gastrointestinal:  Negative for abdominal pain, constipation, diarrhea, nausea and vomiting.  Endocrine: Negative.   Genitourinary: Negative.  Negative for difficulty urinating and dysuria.  Musculoskeletal: Negative.  Negative for arthralgias and myalgias.  Skin: Negative.   Neurological:  Negative for dizziness, weakness and light-headedness.  Hematological:  Negative for adenopathy. Does not bruise/bleed easily.  Psychiatric/Behavioral:  Positive for decreased concentration. Negative for agitation, dysphoric mood and suicidal ideas. The patient is not nervous/anxious.     Objective:  BP 118/74 (BP Location: Right Arm, Patient Position: Sitting, Cuff Size: Normal)   Pulse 76   Temp 98.1 F (36.7 C) (Oral)   Resp 16   Ht 5' 11 (1.803 m)   Wt 173 lb (78.5 kg)   SpO2 97%   BMI 24.13 kg/m   BP Readings from Last 3 Encounters:  03/25/24 118/74  02/08/24 130/80  05/31/23 128/78    Wt Readings from Last 3 Encounters:  03/25/24 173 lb (78.5 kg)  02/08/24 176 lb (79.8 kg)  10/09/23 172 lb (78 kg)    Physical Exam Vitals reviewed.  Constitutional:      Appearance: Normal appearance.  HENT:     Mouth/Throat:     Mouth: Mucous membranes are moist.  Eyes:     General: No scleral icterus.    Conjunctiva/sclera: Conjunctivae normal.  Cardiovascular:     Rate and Rhythm: Normal rate and regular rhythm.     Heart sounds: No murmur heard.    No friction  rub. No gallop.  Pulmonary:     Effort: Pulmonary effort is normal.     Breath sounds: No stridor. No wheezing, rhonchi or rales.  Abdominal:     General: Abdomen is flat.     Palpations: There is no mass.     Tenderness: There is no abdominal tenderness. There is no guarding.     Hernia: No hernia is present.  Musculoskeletal:        General: Normal range of motion.     Cervical back: Neck supple.     Right lower leg: No  edema.     Left lower leg: No edema.  Lymphadenopathy:     Cervical: No cervical adenopathy.  Skin:    General: Skin is warm and dry.  Neurological:     General: No focal deficit present.     Mental Status: He is alert. Mental status is at baseline.  Psychiatric:        Mood and Affect: Mood normal.        Behavior: Behavior normal.     Lab Results  Component Value Date   WBC 4.1 02/08/2024   HGB 15.0 02/08/2024   HCT 44.6 02/08/2024   PLT 177.0 02/08/2024   GLUCOSE 85 02/08/2024   CHOL 128 05/31/2023   TRIG 121.0 05/31/2023   HDL 48.50 05/31/2023   LDLDIRECT 140.0 07/06/2017   LDLCALC 55 05/31/2023   ALT 20 05/31/2023   AST 19 05/31/2023   NA 142 02/08/2024   K 4.7 02/08/2024   CL 106 02/08/2024   CREATININE 1.77 (H) 02/08/2024   BUN 23 02/08/2024   CO2 30 02/08/2024   TSH 2.03 05/31/2023   PSA 2.03 02/08/2024    CT CARDIAC SCORING ([facility] LOCATIONS ONLY) Result Date: 07/15/2023 CLINICAL DATA:  Hyperlipidemia * Tracking Code: FCC * EXAM: CT CARDIAC CORONARY ARTERY CALCIUM  SCORE TECHNIQUE: Non-contrast imaging through the heart was performed using prospective ECG gating. Image post processing was performed on an independent workstation, allowing for quantitative analysis of the heart and coronary arteries. Note that this exam targets the heart and the chest was not imaged in its entirety. COMPARISON:  02/28/2011 FINDINGS: CORONARY CALCIUM  SCORES: Left Main: 10.2 LAD: 394 LCx: 16.5 RCA/PDA: 0 Total Agatston Score: 420 MESA database percentile: 71 AORTA MEASUREMENTS: Ascending Aorta: 3.8 cm Descending Aorta:2.5 cm OTHER FINDINGS: Heart is normal size. Aorta normal caliber. Moderate calcifications in the aortic root and descending thoracic aorta. No adenopathy. No confluent opacities or effusions. No acute findings in the upper abdomen. Chest wall soft tissues are unremarkable. No acute bony abnormality. IMPRESSION: Total Agatston score: 71 [place] database percentile: 3.8 Aortic  atherosclerosis. No acute extra cardiac abnormality. Electronically Signed   By: Franky Crease M.D.   On: 07/15/2023 21:12    Assessment & Plan:   Attention deficit hyperactivity disorder (ADHD), predominantly inattentive type -     Amphetamine -Dextroamphet ER; Take 1 capsule (20 mg total) by mouth every morning.  Dispense: 30 capsule; Refill: 0  Primary hypertension - His BP is well controlled.     Follow-up: No follow-ups on file.  Debby Molt, MD "

## 2024-04-12 ENCOUNTER — Other Ambulatory Visit: Payer: Self-pay | Admitting: Internal Medicine

## 2024-04-12 DIAGNOSIS — E785 Hyperlipidemia, unspecified: Secondary | ICD-10-CM

## 2024-04-15 ENCOUNTER — Other Ambulatory Visit: Payer: Self-pay | Admitting: Internal Medicine

## 2024-04-18 ENCOUNTER — Telehealth: Payer: Self-pay

## 2024-04-18 NOTE — Telephone Encounter (Signed)
 Copied from CRM [id number]. Topic: Referral - Request for Referral >> Apr 18, 2024  4:13 PM Fonda T wrote: Did the patient discuss referral with their provider in the last year? Yes   Appointment offered? Yes  Type of order/referral and detailed reason for visit: Cardiology for surgery clearance for knee surgery, scheduled on 06/11/24  Preference of office, provider, location: Needs as soon as possible due to upcoming surgery,  No preference  If referral process sooner also possibility for sooner cancellation appt  If referral order, have you been seen by this specialty before? Yes, office was located on [address]    Can we respond through MyChart? No, prefers phone call to [phone]  Pt is requesting a follow up call once referral has been placed.

## 2024-04-19 ENCOUNTER — Other Ambulatory Visit: Payer: Self-pay | Admitting: Internal Medicine

## 2024-04-19 DIAGNOSIS — R931 Abnormal findings on diagnostic imaging of heart and coronary circulation: Secondary | ICD-10-CM

## 2024-04-19 NOTE — Telephone Encounter (Signed)
 Urgent referral ordered

## 2024-04-19 NOTE — Telephone Encounter (Signed)
 Please advise.

## 2024-04-19 NOTE — Telephone Encounter (Signed)
 Copied from CRM [id number]. Topic: General - Other >> Apr 19, 2024  2:54 PM Joesph NOVAK wrote: Reason for CRM: Patient is requesting for pcp's nurse to contact [facility] - in regards to referral for cardiology.  [phone] [facility]  Or[facility]  [phone]

## 2024-04-23 NOTE — Telephone Encounter (Signed)
 Patient states that everything has been handled.

## 2024-04-24 ENCOUNTER — Encounter: Payer: Self-pay | Admitting: Cardiology

## 2024-04-24 ENCOUNTER — Ambulatory Visit: Attending: Cardiology | Admitting: Cardiology

## 2024-04-24 VITALS — BP 120/70 | HR 89 | Ht 71.0 in | Wt 169.0 lb

## 2024-04-24 DIAGNOSIS — Z01810 Encounter for preprocedural cardiovascular examination: Secondary | ICD-10-CM | POA: Diagnosis not present

## 2024-04-24 DIAGNOSIS — R931 Abnormal findings on diagnostic imaging of heart and coronary circulation: Secondary | ICD-10-CM | POA: Diagnosis not present

## 2024-04-24 DIAGNOSIS — E785 Hyperlipidemia, unspecified: Secondary | ICD-10-CM | POA: Insufficient documentation

## 2024-04-24 DIAGNOSIS — N1831 Chronic kidney disease, stage 3a: Secondary | ICD-10-CM | POA: Diagnosis not present

## 2024-04-24 DIAGNOSIS — I10 Essential (primary) hypertension: Secondary | ICD-10-CM | POA: Diagnosis not present

## 2024-04-24 MED ORDER — ASPIRIN 81 MG PO TBEC: 81.0000 mg | DELAYED_RELEASE_TABLET | Freq: Every day | ORAL | Status: AC

## 2024-04-24 NOTE — Progress Notes (Unsigned)
 " Cardiology Office Note:  .   Date:  04/25/2024  ID:  [name] Lambert, DOB 10-09-1955, MRN [id number] PCP: [name] Debby CROME, MD  [name] Lambert Cardiologist:  Alm Clay, MD     Chief Complaint  Patient presents with   New Patient (Initial Visit)    Evaluation because of elevated Coronary Calcium  Score; needs preop clearance   Coronary Artery Disease    Cornary Calcium  Score 420.  No symptoms.    Patient Profile: .     [name] Lambert is a 69 y.o. male with a PMH notable for ADHD and HTN, CKD 3 A, and well-controlled HLD who presents here for cardiology consult based on Coronary Calcium  Score of 420.  At the request of [name] Debby CROME, MD.  [name] Lambert was last seen by Dr. Joshua on March 25, 2024 for routine follow-up with management of his ADHD.  Review of symptoms was negative from a cardiac standpoint.  They reviewed his Coronary Calcium  Score and he is referred for cardiology evaluation.  The preceding month he was seen in November 2025-noted that he was maintaining active lifestyle-including walking his dog and doing yard work daily.  BP was well-controlled on candesartan .  He was on Farxiga  10 mg for CKD 3 A, and rosuvastatin  10 mg for lipids    Subjective  Discussed the use of AI scribe software for clinical note transcription with the patient, who gave verbal consent to proceed.  History of Present Illness [name] Lambert is a 69 year old male with hypertension and coronary artery disease who presents for preoperative cardiac evaluation prior to knee replacement surgery. He was referred by Dr. Joshua for evaluation following a coronary calcium  score.  He is scheduled for a right knee replacement surgery, having had the left knee replaced two years ago with good recovery. He remains active, walking his dog twice a day and performing yard work without any issues. No chest pain, shortness of breath, or other cardiac symptoms during these activities.  He is  currently on candesartan  16 mg for blood pressure, rosuvastatin  10 mg for cholesterol, Jardiance  10 mg for kidney protection, and medication for ADHD. His most recent LDL was 55 mg/dL, down from 859 mg/dL in 7980.  No history of smoking and no family history of heart disease or diabetes. He has not experienced any symptoms such as heart racing, leg swelling, or neurological deficits.  He recalls his mother experiencing easy bruising, and he has noticed similar bruising since turning 66, which he attributes to aging skin.   Cardiovascular ROS: no chest pain or dyspnea on exertion negative for - edema, irregular heartbeat, orthopnea, palpitations, paroxysmal nocturnal dyspnea, rapid heart rate, shortness of breath, or lightheadedness, dizziness or wooziness, syncope or near syncope, TIA or ramus VS [place] claudication.  Currently activity somewhat limited by right knee pain.  Did well after left knee surgery.    Objective   Past Medical History - Hypertension - Coronary artery disease with elevated coronary calcium  score - Attention-deficit/hyperactivity disorder (ADHD)  Surgical History: - Left knee replacement (2 years ago)  Social History - Tobacco: Never smoker  Family History - No family history of heart disease or diabetes.  Medications - Candesartan  16 mg - Jardiance  10 mg - Rosuvastatin  10 mg daily - Adderall 20 mg every morning  Studies Reviewed: SABRA   EKG Interpretation Date/Time:  Wednesday April 24 2024 09:38:28 EST Ventricular Rate:  80 PR Interval:  190 QRS Duration:  94 QT Interval:  382 QTC Calculation: 440 R Axis:   86  Text Interpretation: Normal sinus rhythm Normal ECG When compared with ECG of 31-Dec-2012 13:35, No significant change was found Confirmed by [name] Lambert ([id number]) on 04/24/2024 9:52:54 AM    Lab Results  Component Value Date   CHOL 128 05/31/2023   HDL 48.50 05/31/2023   LDLCALC 55 05/31/2023   LDLDIRECT 140.0 07/06/2017   TRIG 121.0  05/31/2023   CHOLHDL 3 05/31/2023   No results found for: HGBA1C Lab Results  Component Value Date   NA 142 02/08/2024   K 4.7 02/08/2024   CREATININE 1.77 (H) 02/08/2024   GFR 38.54 (L) 02/08/2024   GLUCOSE 85 02/08/2024   Cardiac Studies Coronary Calcium  Score CT (07/15/2023): CAC total 420 (LM 10.2, LAD 394, LCx 60.5. Echocardiogram (01/01/2013): EF 55 to 60%.  Normal wall motion.  G1 DD.  Normal valves.  No effusion.  Risk Assessment/Calculations:        Mr. Seago perioperative risk of a major cardiac event is 0.4% according to the Revised Cardiac Risk Index (RCRI).  Therefore, he is at low risk for perioperative complications.   His functional capacity is excellent at 9.89 METs according to the Duke Activity Status Index (DASI). Mostly limited by knee pain Recommendations: According to ACC/AHA guidelines, no further cardiovascular testing needed.  The patient may proceed to surgery at acceptable risk.   Antiplatelet and/or Anticoagulation Recommendations: Aspirin  can be held for 5-7 days prior to his surgery.  Please resume Aspirin  post operatively when it is felt to be safe from a bleeding standpoint.  Hold Jardiance  1 week preop, and candesartan  on operation day      Physical Exam:   VS:  BP 120/70 (BP Location: Left Arm, Patient Position: Sitting, Cuff Size: Normal)   Pulse 89   Ht 5' 11 (1.803 m)   Wt 169 lb (76.7 kg)   SpO2 96%   BMI 23.57 kg/m    Wt Readings from Last 3 Encounters:  04/24/24 169 lb (76.7 kg)  03/25/24 173 lb (78.5 kg)  02/08/24 176 lb (79.8 kg)     GEN: Well nourished, well groomed; in no acute distress; healthy appearing NECK: No JVD; No carotid bruits CARDIAC: Normal S1, S2; RRR, no murmurs, rubs, gallops RESPIRATORY:  Clear to auscultation without rales, wheezing or rhonchi ; nonlabored, good air movement. ABDOMEN: Soft, non-tender, non-distended EXTREMITIES:  No edema; No deformity     ASSESSMENT AND PLAN: .   Pre-operative  cardiovascular examination  Preoperative cardiovascular risk assessment for knee replacement Low cardiovascular risk for knee replacement surgery.  Coronary artery calcium  score over 300 indicates presence of coronary plaque, but absence of symptoms which suggest no stenoses.   Current activity level is 8 metabolic equivalents or more. No clinical indication for stress test evaluation. - Hold aspirin  5-7 days preoperatively. - Proceed with knee replacement surgery as scheduled.  Mr. Kelemen perioperative risk of a major cardiac event is 0.4% according to the Revised Cardiac Risk Index (RCRI).  Therefore, he is at low risk for perioperative complications.   His functional capacity is excellent at 9.89 METs according to the Duke Activity Status Index (DASI). Mostly limited by knee pain Recommendations: According to ACC/AHA guidelines, no further cardiovascular testing needed.  The patient may proceed to surgery at acceptable risk.   Antiplatelet and/or Anticoagulation Recommendations: Aspirin  can be held for 5-7 days prior to his surgery.  Please resume Aspirin  post operatively when it is felt to  be safe from a bleeding standpoint.  Hold Jardiance  1 week preop, and candesartan  on operation day  Primary hypertension Blood pressure is well controlled with candesartan . Current regimen is effective and protective for both kidney and heart health. - Continue candesartan  16 mg daily as prescribed.  Agatston CAC score, >400 Coronary artery calcium  score over 300 indicates plaque presence but cannot assess for blockages .  Completely asymptomatic with vigorous level activity. No need for further testing unless symptoms develop. Current management includes aggressive risk factor modification with rosuvastatin  10 mg, candesartan  16 mg, and Jardiance  10 mg daily for cardiac and renal protection.  - Start 81 mg aspirin  daily for additional risk reduction. - Monitor for symptoms such as increased  fatigue, shortness of breath, or chest tightness.  Hyperlipidemia with target low density lipoprotein (LDL) cholesterol less than 70 mg/dL Started on 10 mg rosuvastatin  by PCP.  Now with CAC score over 400, recommend target LDL less than 70. LDL cholesterol is well controlled with rosuvastatin , with recent levels at 55 mg/dL, below the target of less than 70 mg/dL for patients with known blockages. - Continue rosuvastatin  as prescribed.  Stage 3a chronic kidney disease (HCC) Estimated Creatinine Clearance: 41.4 mL/min (A) (by C-G formula based on SCr of 1.77 mg/dL (H)).  Started on Jardiance  by PCP. Still on a stable level to continue ARB.   Orders Placed This Encounter  Procedures   EKG 12-Lead          Follow-Up: Return in about 2 years (around 04/24/2026) for [employer] to reassess symptoms and risk factors.  I spent 43 minutes in the care of PERLIE SCHEURING today including reviewing labs (1 minute), reviewing studies (2 minutes reviewing echo and Coronary Calcium  Score), face to face time discussing treatment options (19 minutes), reviewing records from PCP clinic notes (5 minutes), 4 minutes with decision making, 11 minutes dictating, and documenting in the encounter.   Portions of this note were dictated using DRAGON voice recognition software. Please disregard any errors in transcription. This record has been created using Conservation officer, historic buildings. Errors have been sought and corrected, but may not always be located. Such creation errors do not reflect on the standard of medical care.    Signed, Alm MICAEL Clay, MD, MS Alm Clay, M.D., M.S. Interventional Cardiologist  [facility] Pager # [phone]       "

## 2024-04-24 NOTE — Assessment & Plan Note (Signed)
 Pre-op R Knee TKA ->         Recommendations:  Antiplatelet and/or Anticoagulation Recommendations:

## 2024-04-24 NOTE — Patient Instructions (Addendum)
 Medication Instructions:   No changes *If you need a refill on your cardiac medications before your next appointment, please call your pharmacy*   Lab Work: Not needed    Testing/Procedures: Not needed   Follow-Up: At [facility], you and your health needs are our priority.  As part of our continuing mission to provide you with exceptional heart care, we have created designated Provider Care Teams.  These Care Teams include your primary Cardiologist (physician) and Advanced Practice Providers (APPs -  Physician Assistants and Nurse Practitioners) who all work together to provide you with the care you need, when you need it.     Your next appointment:   24 month(s)  The format for your next appointment:   In Person  Provider:   Alm Clay, MD   Other Instructions    Okay to have right knee surgery from a cardiac standpoint - low risk . You can hold Aspirin   81 mg 5 days prior to surgery.

## 2024-04-25 ENCOUNTER — Encounter: Payer: Self-pay | Admitting: Cardiology

## 2024-04-25 ENCOUNTER — Telehealth: Payer: Self-pay

## 2024-04-25 NOTE — Assessment & Plan Note (Signed)
 Coronary artery calcium  score over 300 indicates plaque presence but cannot assess for blockages .  Completely asymptomatic with vigorous level activity. No need for further testing unless symptoms develop. Current management includes aggressive risk factor modification with rosuvastatin  10 mg, candesartan  16 mg, and Jardiance  10 mg daily for cardiac and renal protection.  - Start 81 mg aspirin  daily for additional risk reduction. - Monitor for symptoms such as increased fatigue, shortness of breath, or chest tightness.

## 2024-04-25 NOTE — Assessment & Plan Note (Signed)
 Estimated Creatinine Clearance: 41.4 mL/min (A) (by C-G formula based on SCr of 1.77 mg/dL (H)).  Started on Jardiance  by PCP. Still on a stable level to continue ARB.

## 2024-04-25 NOTE — Assessment & Plan Note (Signed)
 Started on 10 mg rosuvastatin  by PCP.  Now with CAC score over 400, recommend target LDL less than 70. LDL cholesterol is well controlled with rosuvastatin , with recent levels at 55 mg/dL, below the target of less than 70 mg/dL for patients with known blockages. - Continue rosuvastatin  as prescribed.

## 2024-04-25 NOTE — Assessment & Plan Note (Signed)
 Blood pressure is well controlled with candesartan . Current regimen is effective and protective for both kidney and heart health. - Continue candesartan  16 mg daily as prescribed.

## 2024-04-25 NOTE — Telephone Encounter (Signed)
 Copied from CRM #[id number]. Topic: Clinical - Medication Refill >> Apr 25, 2024  8:19 AM Berwyn MATSU wrote: Medication: amphetamine -dextroamphetamine (ADDERALL XR) 20 MG 24 hr capsule   Has the patient contacted their pharmacy? Yes (Agent: If no, request that the patient contact the pharmacy for the refill. If patient does not wish to contact the pharmacy document the reason why and proceed with request.) (Agent: If yes, when and what did the pharmacy advise?)  This is the patient's preferred pharmacy:  [id number] [place], [place] - [address] AT [facility] OF [address] & [facility] [address] [place] [address] Phone: [phone] Fax: [phone]  Is this the correct pharmacy for this prescription? Yes If no, delete pharmacy and type the correct one.   Has the prescription been filled recently? Yes  Is the patient out of the medication? Yes  Has the patient been seen for an appointment in the last year OR does the patient have an upcoming appointment? Yes  Can we respond through MyChart? Yes  Agent: Please be advised that Rx refills may take up to 3 business days. We ask that you follow-up with your pharmacy.

## 2024-04-26 ENCOUNTER — Other Ambulatory Visit: Payer: Self-pay

## 2024-04-26 DIAGNOSIS — F900 Attention-deficit hyperactivity disorder, predominantly inattentive type: Secondary | ICD-10-CM

## 2024-04-26 MED ORDER — AMPHETAMINE-DEXTROAMPHET ER 20 MG PO CP24: 20.0000 mg | ORAL_CAPSULE | ORAL | 0 refills | Status: AC

## 2024-04-26 NOTE — Telephone Encounter (Signed)
 Medication refill request has been sent to Dr. Yetta Barre

## 2024-04-30 ENCOUNTER — Telehealth: Payer: Self-pay

## 2024-04-30 NOTE — Telephone Encounter (Signed)
 Copied from CRM #[id number]. Topic: Clinical - Medication Refill >> Apr 30, 2024 10:11 AM Antwanette L wrote: Medication: amphetamine -dextroamphetamine (ADDERALL XR) 20 MG 24 hr capsule  Has the patient contacted their pharmacy? Yes. The pharmacy has not received an order.   This is the patient's preferred pharmacy:  [id number] [place], [place] - [address] AT [facility] OF [address] & [facility] [address] [place] [address] Phone: [phone] Fax: [phone]  Is this the correct pharmacy for this prescription? Yes   Has the prescription been filled recently? Yes. Last refill was on 04/26/24  Is the patient out of the medication? No. Patient has one pill left  Has the patient been seen for an appointment in the last year OR does the patient have an upcoming appointment? Yes. Last office visit with Dr. Joshua was on 03/25/24  Can we respond through MyChart? No. Patient can be reached at [phone]  Agent: Please be advised that Rx refills may take up to 3 business days. We ask that you follow-up with your pharmacy. >> Apr 30, 2024 10:18 AM Antwanette L wrote: According to the pt chart, the order was placed on 04/26/24 but the pharmacy informed the pt they never received an order.

## 2024-04-30 NOTE — Telephone Encounter (Signed)
 Unable to reach patient. LMTRC

## 2024-04-30 NOTE — Telephone Encounter (Signed)
 Unable to reach the patient [facility]. Unable to speak with the pharmacy they were temporarily closed.

## 2024-04-30 NOTE — Telephone Encounter (Signed)
 Copied from CRM #[id number]. Topic: General - Other >> Apr 30, 2024 10:22 AM Antwanette L wrote: Reason for CRM: The pt is scheduled for knee surgery in March. The pt reports that Dr. Harding(cardiologist) office sent a letter to inform Dr. Joshua that everything looks good.

## 2024-05-03 NOTE — Telephone Encounter (Signed)
Patient has received his medication.

## 2024-05-03 NOTE — Telephone Encounter (Signed)
 Spoke with the patient and advised him that this letter was supposed to be sent to his surgeon. Dr. Joshua has already cleared him for surgery.

## 2024-10-09 ENCOUNTER — Ambulatory Visit
